# Patient Record
Sex: Male | Born: 1970 | Race: Black or African American | Hispanic: No | Marital: Single | Smoking: Current every day smoker
Health system: Southern US, Community
[De-identification: ages and names within clinical notes are randomized; demographics above are authoritative.]

## PROBLEM LIST (undated history)

## (undated) DIAGNOSIS — I10 Essential (primary) hypertension: Secondary | ICD-10-CM

---

## 2002-08-29 ENCOUNTER — Emergency Department (HOSPITAL_COMMUNITY): Admission: EM | Admit: 2002-08-29 | Discharge: 2002-08-29 | Payer: Self-pay | Admitting: Emergency Medicine

## 2002-11-27 ENCOUNTER — Emergency Department (HOSPITAL_COMMUNITY): Admission: EM | Admit: 2002-11-27 | Discharge: 2002-11-27 | Payer: Self-pay | Admitting: Emergency Medicine

## 2002-11-27 ENCOUNTER — Encounter: Payer: Self-pay | Admitting: Emergency Medicine

## 2003-08-30 ENCOUNTER — Emergency Department (HOSPITAL_COMMUNITY): Admission: EM | Admit: 2003-08-30 | Discharge: 2003-08-30 | Payer: Self-pay | Admitting: Emergency Medicine

## 2010-07-06 ENCOUNTER — Emergency Department (HOSPITAL_COMMUNITY)
Admission: EM | Admit: 2010-07-06 | Discharge: 2010-07-06 | Payer: Self-pay | Source: Home / Self Care | Admitting: Emergency Medicine

## 2010-07-13 ENCOUNTER — Emergency Department (HOSPITAL_COMMUNITY): Admission: EM | Admit: 2010-07-13 | Discharge: 2009-11-26 | Payer: Self-pay | Admitting: Emergency Medicine

## 2011-04-11 ENCOUNTER — Emergency Department (HOSPITAL_COMMUNITY): Payer: Self-pay

## 2011-04-11 ENCOUNTER — Emergency Department (HOSPITAL_COMMUNITY)
Admission: EM | Admit: 2011-04-11 | Discharge: 2011-04-11 | Disposition: A | Discharge status: Home / Self Care | Payer: Self-pay | Attending: Emergency Medicine | Admitting: Emergency Medicine

## 2011-04-11 DIAGNOSIS — M25579 Pain in unspecified ankle and joints of unspecified foot: Secondary | ICD-10-CM | POA: Insufficient documentation

## 2011-04-11 DIAGNOSIS — M79609 Pain in unspecified limb: Secondary | ICD-10-CM | POA: Insufficient documentation

## 2011-04-11 DIAGNOSIS — X500XXA Overexertion from strenuous movement or load, initial encounter: Secondary | ICD-10-CM | POA: Insufficient documentation

## 2011-04-11 DIAGNOSIS — S92309A Fracture of unspecified metatarsal bone(s), unspecified foot, initial encounter for closed fracture: Secondary | ICD-10-CM | POA: Insufficient documentation

## 2012-03-17 ENCOUNTER — Emergency Department (HOSPITAL_COMMUNITY)
Admission: EM | Admit: 2012-03-17 | Discharge: 2012-03-17 | Disposition: A | Discharge status: Home / Self Care | Payer: Self-pay | Attending: Emergency Medicine | Admitting: Emergency Medicine

## 2012-03-17 ENCOUNTER — Encounter (HOSPITAL_COMMUNITY): Payer: Self-pay | Admitting: *Deleted

## 2012-03-17 DIAGNOSIS — IMO0002 Reserved for concepts with insufficient information to code with codable children: Secondary | ICD-10-CM

## 2012-03-17 DIAGNOSIS — F17200 Nicotine dependence, unspecified, uncomplicated: Secondary | ICD-10-CM | POA: Insufficient documentation

## 2012-03-17 DIAGNOSIS — S51809A Unspecified open wound of unspecified forearm, initial encounter: Secondary | ICD-10-CM | POA: Insufficient documentation

## 2012-03-17 MED ORDER — LIDOCAINE HCL 2 % IJ SOLN
10.0000 mL | Freq: Once | INTRAMUSCULAR | Status: AC
  Administered 2012-03-17: 20 mg via INTRADERMAL

## 2012-03-17 MED ORDER — TRAMADOL HCL 50 MG PO TABS: 50.0000 mg | ORAL_TABLET | Freq: Four times a day (QID) | ORAL | Status: AC | PRN

## 2012-03-17 MED ORDER — CEPHALEXIN 500 MG PO CAPS: 500.0000 mg | ORAL_CAPSULE | Freq: Four times a day (QID) | ORAL | Status: AC
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
## 2012-03-17 NOTE — ED Provider Notes (Signed)
Medical screening examination/treatment/procedure(s) were performed by non-physician practitioner and as supervising physician I was immediately available for consultation/collaboration.   Gwyneth Sprout, MD 03/17/12 [phone]
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
## 2012-03-31 ENCOUNTER — Emergency Department (HOSPITAL_COMMUNITY)
Admission: EM | Admit: 2012-03-31 | Discharge: 2012-03-31 | Disposition: A | Discharge status: Home / Self Care | Payer: Self-pay | Attending: Emergency Medicine | Admitting: Emergency Medicine

## 2012-03-31 ENCOUNTER — Encounter (HOSPITAL_COMMUNITY): Payer: Self-pay | Admitting: *Deleted

## 2012-03-31 DIAGNOSIS — I10 Essential (primary) hypertension: Secondary | ICD-10-CM | POA: Insufficient documentation

## 2012-03-31 DIAGNOSIS — Z4802 Encounter for removal of sutures: Secondary | ICD-10-CM | POA: Insufficient documentation

## 2012-03-31 DIAGNOSIS — F17200 Nicotine dependence, unspecified, uncomplicated: Secondary | ICD-10-CM | POA: Insufficient documentation

## 2012-03-31 HISTORY — DX: Essential (primary) hypertension: I10

## 2012-03-31 NOTE — ED Notes (Signed)
Pt presents wanting suture check, has had sutures for 14 days. Patient sts that the area sutured is tender to the touch, area appears slightly red. Patient sts no headache, fever, nausea or abdominal pain.

## 2012-04-02 NOTE — ED Provider Notes (Signed)
History     CSN: [id number]  Arrival date & time 03/31/12  1721   None     Chief Complaint  Patient presents with  . Wound Check    (Consider location/radiation/quality/duration/timing/severity/associated sxs/prior treatment) HPI Comments: Patient presents for suture removal after a laceration occurring 14 days ago. Patient reports mild tenderness at this site but no complications. He denies any drainage from the wound site. He denies NVD, fever, right arm pain.   Patient is a 41 y.o. male presenting with wound check.  Wound Check     Past Medical History  Diagnosis Date  . Hypertension     History reviewed. No pertinent past surgical history.  No family history on file.  History  Substance Use Topics  . Smoking status: Current Everyday Smoker  . Smokeless tobacco: Not on file  . Alcohol Use: Yes     socially, weekends      Review of Systems  Constitutional: Negative for fever, chills, diaphoresis and fatigue.  Respiratory: Negative for shortness of breath.   Cardiovascular: Negative for chest pain.  Gastrointestinal: Negative for nausea, vomiting and diarrhea.  Skin: Positive for wound.  Neurological: Negative for dizziness, weakness, numbness and headaches.    Allergies  Review of patient's allergies indicates no known allergies.  Home Medications   Current Outpatient Rx  Name Route Sig Dispense Refill  . CEPHALEXIN 500 MG PO CAPS Oral Take 500 mg by mouth 4 (four) times daily.      BP 149/98  Pulse 67  Temp 98.4 F (36.9 C) (Oral)  Resp 16  Ht 5\' 5"  (1.651 m)  Wt 135 lb (61.236 kg)  BMI 22.47 kg/m2  SpO2 100%  Physical Exam  Nursing note and vitals reviewed. Constitutional: He is oriented to person, place, and time. He appears well-developed and well-nourished. No distress.  HENT:  Head: Normocephalic and atraumatic.  Eyes: Conjunctivae are normal. No scleral icterus.  Neck: Normal range of motion. Neck supple.  Cardiovascular: Normal  rate and regular rhythm.  Exam reveals no gallop and no friction rub.   No murmur heard. Pulmonary/Chest: Effort normal and breath sounds normal. No respiratory distress. He has no wheezes. He has no rales.  Musculoskeletal: Normal range of motion.  Neurological: He is alert and oriented to person, place, and time.  Skin: Skin is warm and dry. He is not diaphoretic.       3 cm healed laceration of volar aspect of right forearm. No drainage noted. Mild tenderness to palpation of site. Small scabs noted where the sutures were.   Psychiatric: He has a normal mood and affect. His behavior is normal.    ED Course  Procedures (including critical care time)  SUTURE REMOVAL Performed by: Emilia Beck  Consent: Verbal consent obtained. Patient identity confirmed: provided demographic data Time out: Immediately prior to procedure a "time out" was called to verify the correct patient, procedure, equipment, support staff and site/side marked as required.  Location details: right forearm  Wound Appearance: clean  Sutures/Staples Removed: 3 sutures  Facility: sutures placed in this facility Patient tolerance: Patient tolerated the procedure well with no immediate complications.     Labs Reviewed - No data to display No results found.   1. Visit for suture removal       MDM  Patient's wound is healing with no signs of infection. No further evaluation needed at this time. Patient can be discharged with instructions to keep the area covered for the next few days  while at work, due to his work environment being around [employer].         Emilia Beck, PA-C 04/02/12 0023

## 2012-04-04 NOTE — ED Provider Notes (Signed)
Medical screening examination/treatment/procedure(s) were performed by non-physician practitioner and as supervising physician I was immediately available for consultation/collaboration.  Muntaha Vermette T Marella Vanderpol, MD 04/04/12 2356 

## 2013-03-25 ENCOUNTER — Emergency Department (HOSPITAL_COMMUNITY)
Admission: EM | Admit: 2013-03-25 | Discharge: 2013-03-25 | Discharge status: Against Medical Advice | Payer: Self-pay | Attending: Emergency Medicine | Admitting: Emergency Medicine

## 2013-03-25 ENCOUNTER — Encounter (HOSPITAL_COMMUNITY): Payer: Self-pay | Admitting: Emergency Medicine

## 2013-03-25 DIAGNOSIS — I10 Essential (primary) hypertension: Secondary | ICD-10-CM | POA: Insufficient documentation

## 2013-03-25 DIAGNOSIS — Z202 Contact with and (suspected) exposure to infections with a predominantly sexual mode of transmission: Secondary | ICD-10-CM | POA: Insufficient documentation

## 2013-03-25 DIAGNOSIS — R109 Unspecified abdominal pain: Secondary | ICD-10-CM | POA: Insufficient documentation

## 2013-03-25 DIAGNOSIS — N489 Disorder of penis, unspecified: Secondary | ICD-10-CM | POA: Insufficient documentation

## 2013-03-25 DIAGNOSIS — F17200 Nicotine dependence, unspecified, uncomplicated: Secondary | ICD-10-CM | POA: Insufficient documentation

## 2013-03-25 LAB — URINALYSIS, ROUTINE W REFLEX MICROSCOPIC
Bilirubin Urine: NEGATIVE
Leukocytes, UA: NEGATIVE
Nitrite: NEGATIVE
Specific Gravity, Urine: 1.031 — ABNORMAL HIGH (ref 1.005–1.030)
Urobilinogen, UA: 0.2 mg/dL (ref 0.0–1.0)
pH: 5.5 (ref 5.0–8.0)

## 2013-03-25 MED ORDER — CEFTRIAXONE SODIUM 250 MG IJ SOLR
250.0000 mg | Freq: Once | INTRAMUSCULAR | Status: AC
  Administered 2013-03-25: 250 mg via INTRAMUSCULAR
  Filled 2013-03-25: qty 250

## 2013-03-25 MED ORDER — AZITHROMYCIN 1 G PO PACK
1.0000 g | PACK | Freq: Once | ORAL | Status: AC
  Administered 2013-03-25: 1 g via ORAL
  Filled 2013-03-25: qty 1

## 2013-03-25 NOTE — ED Provider Notes (Signed)
CSN: [id number]     Arrival date & time 03/25/13  1251 History     None    Chief Complaint  Patient presents with  . Exposure to STD  . Penis Pain   (Consider location/radiation/quality/duration/timing/severity/associated sxs/prior Treatment) HPI Comments: Patient is a 42 year old male past medical history significant for hypertension presented to the emergency department for 4 days of suprapubic discomfort along with burning and itching at the tip of the penis. He describes any testicular or penile swelling or pain, penile discharge, dysuria, frequency, urgency.   Patient is a 42 y.o. male presenting with STD exposure and penile pain.  Exposure to STD Associated symptoms include abdominal pain. Pertinent negatives include no fever, nausea or vomiting.  Penis Pain Associated symptoms include abdominal pain. Pertinent negatives include no fever, nausea or vomiting.    Past Medical History  Diagnosis Date  . Hypertension    No past surgical history on file. No family history on file. History  Substance Use Topics  . Smoking status: Current Every Day Smoker  . Smokeless tobacco: Not on file  . Alcohol Use: Yes     Comment: socially, weekends    Review of Systems  Constitutional: Negative for fever.  Gastrointestinal: Positive for abdominal pain. Negative for nausea, vomiting, diarrhea, constipation, blood in stool, abdominal distention, anal bleeding and rectal pain.  Genitourinary: Positive for penile pain. Negative for dysuria, penile swelling, scrotal swelling and testicular pain.  All other systems reviewed and are negative.    Allergies  Review of patient's allergies indicates no known allergies.  Home Medications  No current outpatient prescriptions on file. BP 138/84  Pulse 74  Temp(Src) 98.4 F (36.9 C)  Resp 14  SpO2 98% Physical Exam  Constitutional: He is oriented to person, place, and time. He appears well-developed and well-nourished.  HENT:  Head:  Normocephalic and atraumatic.  Eyes: Conjunctivae are normal.  Neck: Neck supple.  Cardiovascular: Normal rate, regular rhythm and normal heart sounds.   Pulmonary/Chest: Effort normal and breath sounds normal.  Abdominal: Soft. Bowel sounds are normal. There is tenderness in the suprapubic area. There is no rigidity, no rebound, no guarding and no CVA tenderness.  Genitourinary: Testes normal and penis normal. Right testis shows no mass, no swelling and no tenderness. Left testis shows no mass, no swelling and no tenderness. Circumcised. No penile erythema or penile tenderness. No discharge found.  Lymphadenopathy:       Right: No inguinal adenopathy present.       Left: No inguinal adenopathy present.  Neurological: He is alert and oriented to person, place, and time.  Skin: Skin is warm and dry.    ED Course   Procedures (including critical care time)  Medications  azithromycin (ZITHROMAX) powder 1 g (1 g Oral Given 03/25/13 1615)  cefTRIAXone (ROCEPHIN) injection 250 mg (250 mg Intramuscular Given 03/25/13 1615)     Labs Reviewed  GC/CHLAMYDIA PROBE AMP  URINALYSIS, ROUTINE W REFLEX MICROSCOPIC   No results found. 1. Possible exposure to STD     MDM  Afebrile, AAOx4, NAD non toxic appearing. Patient to be discharged with instructions to follow up with PCP. Pt understands GC/Chlamydia cultures pending and that they will need to inform all sexual partners within the last 6 months if results return positive. Pt has been treated prophylacticly with azithromycin and rocephin due to pts history, pelvic exam, and wet prep with increased WBCs. Pt advised that he will receive a call in 48 hours if the test  is positive and to refrain from sexual activity for 48 hours. If the test is positive, pt is advised to refrain from sexual activity for 10 days for the medicine to take effect.  Discussed that because pt has had recent unprotected sex, might want to consider getting tested for HIV as  well. Counseled pt that latex condoms are the only way to prevent against STDs or HIV. Patient eloped from ED. Left prior to receiving UA results or d/c paperwork.   Jeannetta Ellis, PA-C 03/25/13 1634  Patient returned to ED for UA results. UA results reviewed. Afebrile, AAOx4, NAD, non-toxic appearing. Tolerated PO intake in ED. Results discussed. F/u advised w/ PCP. Patient is agreeable to plan. Patient is stable at time of discharge    Jeannetta Ellis, PA-C 03/26/13 1525

## 2013-03-25 NOTE — Progress Notes (Signed)
[facility] provided patient with a list of primary care resources in [place] and a [facility] [employer].

## 2013-03-25 NOTE — ED Notes (Signed)
Pt c/o low abd pain and pain in his penis x 4 days.  Denies itching or discharge.  Pt coming in with his girlfriend who is c/o vaginal discharge.

## 2013-03-25 NOTE — ED Notes (Signed)
Pt left prior to receiving d/c instructions. PA states she saw pt walk out.

## 2013-03-25 NOTE — ED Notes (Signed)
Pt now back in room. PA aware.

## 2013-03-26 LAB — GC/CHLAMYDIA PROBE AMP
CT Probe RNA: NEGATIVE
GC Probe RNA: NEGATIVE

## 2013-03-26 NOTE — ED Provider Notes (Signed)
Medical screening examination/treatment/procedure(s) were performed by non-physician practitioner and as supervising physician I was immediately available for consultation/collaboration.    Vernal Rutan R Shyan Scalisi, MD 03/26/13 1536 

## 2013-10-18 ENCOUNTER — Emergency Department (HOSPITAL_COMMUNITY)
Admission: EM | Admit: 2013-10-18 | Discharge: 2013-10-18 | Disposition: A | Discharge status: Home / Self Care | Payer: Self-pay | Attending: Emergency Medicine | Admitting: Emergency Medicine

## 2013-10-18 ENCOUNTER — Encounter (HOSPITAL_COMMUNITY): Payer: Self-pay | Admitting: Emergency Medicine

## 2013-10-18 DIAGNOSIS — F17200 Nicotine dependence, unspecified, uncomplicated: Secondary | ICD-10-CM | POA: Insufficient documentation

## 2013-10-18 DIAGNOSIS — Z202 Contact with and (suspected) exposure to infections with a predominantly sexual mode of transmission: Secondary | ICD-10-CM

## 2013-10-18 DIAGNOSIS — I10 Essential (primary) hypertension: Secondary | ICD-10-CM | POA: Insufficient documentation

## 2013-10-18 DIAGNOSIS — Z792 Long term (current) use of antibiotics: Secondary | ICD-10-CM | POA: Insufficient documentation

## 2013-10-18 DIAGNOSIS — Z2089 Contact with and (suspected) exposure to other communicable diseases: Secondary | ICD-10-CM | POA: Insufficient documentation

## 2013-10-18 MED ORDER — METRONIDAZOLE 500 MG PO TABS: 500.0000 mg | ORAL_TABLET | Freq: Two times a day (BID) | ORAL | Status: DC

## 2013-10-18 MED ORDER — METRONIDAZOLE 500 MG PO TABS
2000.0000 mg | ORAL_TABLET | Freq: Once | ORAL | Status: AC
  Administered 2013-10-18: 2000 mg via ORAL
  Filled 2013-10-18: qty 4

## 2013-10-18 MED ORDER — CEFTRIAXONE SODIUM 250 MG IJ SOLR
250.0000 mg | Freq: Once | INTRAMUSCULAR | Status: AC
  Administered 2013-10-18: 250 mg via INTRAMUSCULAR
  Filled 2013-10-18: qty 250

## 2013-10-18 MED ORDER — AZITHROMYCIN 250 MG PO TABS
1000.0000 mg | ORAL_TABLET | Freq: Once | ORAL | Status: AC
  Administered 2013-10-18: 1000 mg via ORAL
  Filled 2013-10-18: qty 4

## 2013-10-18 MED ORDER — STERILE WATER FOR INJECTION IJ SOLN
INTRAMUSCULAR | Status: AC
  Filled 2013-10-18: qty 10

## 2013-10-18 NOTE — ED Provider Notes (Signed)
CSN: [id number]     Arrival date & time 10/18/13  1812 History   First MD Initiated Contact with Patient 10/18/13 1831     Chief Complaint  Patient presents with  . SEXUALLY TRANSMITTED DISEASE     (Consider location/radiation/quality/duration/timing/severity/associated sxs/prior Treatment) Patient is a 43 y.o. male presenting with male genitourinary complaint. The history is provided by the patient.  Male GU Problem Presenting symptoms: no dysuria, no penile discharge, no penile pain and no scrotal pain   Presenting symptoms comment:  Pt wants tx for Trich. Gf tested positive for Trich 2 weeks ago and wants him treated. He has no sxs Context: spontaneously   Relieved by:  Nothing Worsened by:  Nothing tried Ineffective treatments:  None tried Associated symptoms: no abdominal pain, no fever, no flank pain, no genital itching, no genital lesions, no genital rash, no hematuria, no nausea, no penile redness, no penile swelling, no scrotal swelling, no urinary frequency, no urinary hesitation, no urinary incontinence, no urinary retention and no vomiting   Risk factors: STD exposure     Past Medical History  Diagnosis Date  . Hypertension    History reviewed. No pertinent past surgical history. History reviewed. No pertinent family history. History  Substance Use Topics  . Smoking status: Current Every Day Smoker  . Smokeless tobacco: Not on file  . Alcohol Use: Yes     Comment: socially, weekends    Review of Systems  Constitutional: Negative for fever, activity change and appetite change.  HENT: Negative for congestion.   Eyes: Negative for discharge, redness and itching.  Respiratory: Negative for cough, shortness of breath and wheezing.   Cardiovascular: Negative for chest pain.  Gastrointestinal: Negative for nausea, vomiting and abdominal pain.  Genitourinary: Negative for bladder incontinence, dysuria, hesitancy, frequency, hematuria, flank pain, discharge, penile  swelling, scrotal swelling, genital sores, penile pain and testicular pain.  Skin: Negative for rash and wound.  Neurological: Negative for seizures and syncope.      Allergies  Review of patient's allergies indicates no known allergies.  Home Medications   Current Outpatient Rx  Name  Route  Sig  Dispense  Refill  . metroNIDAZOLE (FLAGYL) 500 MG tablet   Oral   Take 1 tablet (500 mg total) by mouth 2 (two) times daily.   14 tablet   0    BP 125/84  Pulse 84  Temp(Src) 98.5 F (36.9 C) (Oral)  Resp 18  SpO2 96% Physical Exam  Vitals reviewed. Constitutional: He is oriented to person, place, and time. He appears well-developed and well-nourished. No distress.  HENT:  Head: Normocephalic and atraumatic.  Mouth/Throat: Oropharynx is clear and moist. No oropharyngeal exudate.  Eyes: Conjunctivae and EOM are normal. Pupils are equal, round, and reactive to light. Right eye exhibits no discharge. Left eye exhibits no discharge. No scleral icterus.  Neck: Normal range of motion. Neck supple.  Cardiovascular: Normal rate, regular rhythm, normal heart sounds and intact distal pulses.  Exam reveals no gallop and no friction rub.   No murmur heard. Pulmonary/Chest: Effort normal and breath sounds normal. No respiratory distress. He has no wheezes. He has no rales.  Abdominal: Soft. He exhibits no distension and no mass. There is no tenderness.  Genitourinary: Penis normal. No penile tenderness.  GU exam: no lesions, wounds, or rash. No discharge. No ttp of glans. No scrotal ttp, edema, redness. No hernia  Musculoskeletal: Normal range of motion.  Neurological: He is alert and oriented to person, place, and  time. No cranial nerve deficit. He exhibits normal muscle tone. Coordination normal.  Skin: Skin is warm. No rash noted. He is not diaphoretic.    ED Course  Procedures (including critical care time) Labs Review Labs Reviewed - No data to display Imaging Review No results  found.   EKG Interpretation None      MDM   MDM: 43 y.o. AAM w/ cc: of exposure to Trich. Gf treated 2 weeks ago and wants him treated. No sxs. No complaints. AFVSS, normal exam. Normal GU exam. Offered GC tx which they accepted. Given Rocephin, Azithro, Flagyl. Discharged. Follow up PCP as needed. Care d/w my attending.  Final diagnoses:  Trichomonas exposure    Discharged   Pilar Jarvis, MD 10/18/13 [phone]

## 2013-10-18 NOTE — Discharge Instructions (Signed)
Sexually Transmitted Disease A sexually transmitted disease (STD) is a disease or infection. It may be passed from person to person. It usually is passed during sex. STDs can be spread by different types of germs. These germs are bacteria, viruses, and parasites. An STD can be passed through:  Spit (saliva).  Semen.  Blood.  Mucus from the vagina.  Pee (urine). HOW CAN I LESSEN MY CHANCES OF GETTING AN STD?  Only use condoms labeled "latex," dental dams, and lubricants that wash away with water (water soluble). Do not use petroleum jelly or oils.  Get shots (vaccines) for HPV and hepatitis.  Avoid risky sex behavior that can break the skin. WHAT SHOULD I DO IF I THINK I HAVE AN STD?  See your doctor.  Tell your sex partner(s) that you have an STD. They should be tested and treated.  Do not have sex until your doctor says it is OK. WHEN SHOULD I GET HELP? Get help if:  You have bad belly (abdominal) pain.  You are a man and have puffiness (swelling) or pain in your testicles.  You are a woman and have puffiness in your vagina. MAKE SURE YOU:  Understand these instructions. Document Released: 08/30/2004 Document Revised: 05/13/2013 Document Reviewed: 01/16/2013 [employer] Patient Information 2014 [employer].  

## 2013-10-18 NOTE — ED Notes (Signed)
Pt reports being exposed to an std and needing to get it checked. Denies any penile discharge.

## 2013-10-19 ENCOUNTER — Emergency Department (HOSPITAL_COMMUNITY)
Admission: EM | Admit: 2013-10-19 | Discharge: 2013-10-19 | Disposition: A | Discharge status: Home / Self Care | Payer: No Typology Code available for payment source | Attending: Emergency Medicine | Admitting: Emergency Medicine

## 2013-10-19 ENCOUNTER — Emergency Department (HOSPITAL_COMMUNITY): Payer: No Typology Code available for payment source

## 2013-10-19 ENCOUNTER — Encounter (HOSPITAL_COMMUNITY): Payer: Self-pay | Admitting: Emergency Medicine

## 2013-10-19 DIAGNOSIS — S335XXA Sprain of ligaments of lumbar spine, initial encounter: Secondary | ICD-10-CM

## 2013-10-19 DIAGNOSIS — S139XXA Sprain of joints and ligaments of unspecified parts of neck, initial encounter: Secondary | ICD-10-CM | POA: Insufficient documentation

## 2013-10-19 DIAGNOSIS — S161XXA Strain of muscle, fascia and tendon at neck level, initial encounter: Secondary | ICD-10-CM

## 2013-10-19 DIAGNOSIS — Y9389 Activity, other specified: Secondary | ICD-10-CM | POA: Insufficient documentation

## 2013-10-19 DIAGNOSIS — I10 Essential (primary) hypertension: Secondary | ICD-10-CM | POA: Insufficient documentation

## 2013-10-19 DIAGNOSIS — F17200 Nicotine dependence, unspecified, uncomplicated: Secondary | ICD-10-CM | POA: Insufficient documentation

## 2013-10-19 MED ORDER — HYDROCODONE-ACETAMINOPHEN 5-325 MG PO TABS: 1.0000 | ORAL_TABLET | ORAL | Status: DC | PRN

## 2013-10-19 MED ORDER — DIAZEPAM 5 MG PO TABS: 5.0000 mg | ORAL_TABLET | Freq: Two times a day (BID) | ORAL | Status: DC

## 2013-10-19 MED ORDER — HYDROCODONE-ACETAMINOPHEN 5-325 MG PO TABS
1.0000 | ORAL_TABLET | Freq: Once | ORAL | Status: AC
  Administered 2013-10-19: 1 via ORAL
  Filled 2013-10-19: qty 1

## 2013-10-19 MED ORDER — IBUPROFEN 800 MG PO TABS: 800.0000 mg | ORAL_TABLET | Freq: Three times a day (TID) | ORAL | Status: DC
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
## 2013-10-19 NOTE — ED Notes (Signed)
c-collar placed per RN

## 2013-10-19 NOTE — Discharge Instructions (Signed)
Take vicodin as needed for severe pain and valium as needed for spasm.   Do not drive within four hours of taking these medications (may cause drowsiness or confusion).   Apply heat or ice to neck and back and avoid activities that aggravate pain.  You should return to the ER if you develop change in or worsening of pain, fever (100.5 degrees or greater), increased arm weakness/tingling, inability to walk due to leg weakness or loss of control of bladder/bowels.   Call [facility] [phone]) if you do not have a primary care doctor and would like assistance with finding one.
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
## 2013-10-20 NOTE — ED Provider Notes (Signed)
Medical screening examination/treatment/procedure(s) were performed by non-physician practitioner and as supervising physician I was immediately available for consultation/collaboration.   EKG Interpretation None        Junius ArgyleForrest S Enslee Bibbins, MD 10/20/13 1534
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
## 2015-02-22 ENCOUNTER — Emergency Department (HOSPITAL_COMMUNITY): Payer: Self-pay

## 2015-02-22 ENCOUNTER — Encounter (HOSPITAL_COMMUNITY): Payer: Self-pay

## 2015-02-22 ENCOUNTER — Observation Stay (HOSPITAL_COMMUNITY)
Admission: EM | Admit: 2015-02-22 | Discharge: 2015-02-23 | Disposition: A | Discharge status: Home / Self Care | Payer: Self-pay | Attending: Family Medicine | Admitting: Family Medicine

## 2015-02-22 DIAGNOSIS — Z7982 Long term (current) use of aspirin: Secondary | ICD-10-CM | POA: Insufficient documentation

## 2015-02-22 DIAGNOSIS — M79641 Pain in right hand: Secondary | ICD-10-CM

## 2015-02-22 DIAGNOSIS — F17210 Nicotine dependence, cigarettes, uncomplicated: Secondary | ICD-10-CM | POA: Insufficient documentation

## 2015-02-22 DIAGNOSIS — I10 Essential (primary) hypertension: Secondary | ICD-10-CM | POA: Diagnosis present

## 2015-02-22 DIAGNOSIS — R202 Paresthesia of skin: Secondary | ICD-10-CM | POA: Insufficient documentation

## 2015-02-22 DIAGNOSIS — R0602 Shortness of breath: Secondary | ICD-10-CM | POA: Insufficient documentation

## 2015-02-22 DIAGNOSIS — R079 Chest pain, unspecified: Principal | ICD-10-CM | POA: Diagnosis present

## 2015-02-22 LAB — CBC
HCT: 52.5 % — ABNORMAL HIGH (ref 39.0–52.0)
HEMOGLOBIN: 18.6 g/dL — AB (ref 13.0–17.0)
MCH: 33.1 pg (ref 26.0–34.0)
MCHC: 35.4 g/dL (ref 30.0–36.0)
MCV: 93.4 fL (ref 78.0–100.0)
PLATELETS: 352 10*3/uL (ref 150–400)
RBC: 5.62 MIL/uL (ref 4.22–5.81)
RDW: 12.4 % (ref 11.5–15.5)
WBC: 8.3 10*3/uL (ref 4.0–10.5)

## 2015-02-22 LAB — BASIC METABOLIC PANEL
ANION GAP: 12 (ref 5–15)
BUN: 11 mg/dL (ref 6–20)
CO2: 28 mmol/L (ref 22–32)
Calcium: 10.3 mg/dL (ref 8.9–10.3)
Chloride: 101 mmol/L (ref 101–111)
Creatinine, Ser: 1.26 mg/dL — ABNORMAL HIGH (ref 0.61–1.24)
GFR calc Af Amer: >60 mL/min (ref >60)
Glucose, Bld: 105 mg/dL — ABNORMAL HIGH (ref 65–99)
POTASSIUM: 4.1 mmol/L (ref 3.5–5.1)
Sodium: 141 mmol/L (ref 135–145)

## 2015-02-22 LAB — I-STAT TROPONIN, ED: Troponin i, poc: 0 ng/mL (ref 0.00–0.08)

## 2015-02-22 MED ORDER — ACETAMINOPHEN 650 MG RE SUPP
650.0000 mg | Freq: Four times a day (QID) | RECTAL | Status: DC | PRN
Start: 2015-02-22 — End: 2015-02-23

## 2015-02-22 MED ORDER — NITROGLYCERIN 0.4 MG SL SUBL: 0.4000 mg | SUBLINGUAL_TABLET | SUBLINGUAL | Status: DC | PRN

## 2015-02-22 MED ORDER — METOPROLOL TARTRATE 25 MG PO TABS
25.0000 mg | ORAL_TABLET | Freq: Two times a day (BID) | ORAL | Status: DC
  Administered 2015-02-23 (×2): 25 mg via ORAL
  Filled 2015-02-22 (×2): qty 1

## 2015-02-22 MED ORDER — ASPIRIN 81 MG PO CHEW
324.0000 mg | CHEWABLE_TABLET | Freq: Once | ORAL | Status: AC
  Administered 2015-02-22: 324 mg via ORAL
  Filled 2015-02-22: qty 4

## 2015-02-22 MED ORDER — ASPIRIN EC 81 MG PO TBEC
81.0000 mg | DELAYED_RELEASE_TABLET | Freq: Every day | ORAL | Status: DC
  Administered 2015-02-23: 81 mg via ORAL
  Filled 2015-02-22: qty 1

## 2015-02-22 MED ORDER — ACETAMINOPHEN 325 MG PO TABS: 650.0000 mg | ORAL_TABLET | Freq: Four times a day (QID) | ORAL | Status: DC | PRN

## 2015-02-22 MED ORDER — SODIUM CHLORIDE 0.9 % IJ SOLN
3.0000 mL | Freq: Two times a day (BID) | INTRAMUSCULAR | Status: DC
  Administered 2015-02-23: 3 mL via INTRAVENOUS

## 2015-02-22 MED ORDER — HEPARIN SODIUM (PORCINE) 5000 UNIT/ML IJ SOLN
5000.0000 [IU] | Freq: Three times a day (TID) | INTRAMUSCULAR | Status: DC
  Administered 2015-02-23 (×2): 5000 [IU] via SUBCUTANEOUS
  Filled 2015-02-22 (×3): qty 1

## 2015-02-22 NOTE — ED Provider Notes (Signed)
CSN: [id number]     Arrival date & time 02/22/15  1732 History   First MD Initiated Contact with Patient 02/22/15 1810     Chief Complaint  Patient presents with  . Chest Pain     (Consider location/radiation/quality/duration/timing/severity/associated sxs/prior Treatment) Patient is a 44 y.o. male presenting with chest pain.  Chest Pain Pain location:  L chest Pain quality comment:  Cramping Radiates to: to right side, numbness feeling around right back. Pain radiates to the back: yes   Pain severity:  Moderate Onset quality:  Gradual Duration:  1 day Timing:  Constant Progression:  Unchanged Chronicity:  Recurrent (recurrent episodes like this for several months, has not found care, no PCP) Context comment:  When standing for long period of time, cooking chicken or walking Relieved by:  Rest Associated symptoms: nausea and shortness of breath   Associated symptoms: no abdominal pain, no back pain, no cough, no diaphoresis, no fever, no headache, no syncope and not vomiting   Risk factors: hypertension, male sex and smoking   Risk factors: no coronary artery disease, no diabetes mellitus, no high cholesterol (doesnt see physician), no prior DVT/PE and no surgery     Past Medical History  Diagnosis Date  . Hypertension    History reviewed. No pertinent past surgical history. History reviewed. No pertinent family history. History  Substance Use Topics  . Smoking status: Current Every Day Smoker  . Smokeless tobacco: Not on file  . Alcohol Use: Yes     Comment: socially, weekends    Review of Systems  Constitutional: Negative for fever and diaphoresis.  HENT: Negative for sore throat.   Eyes: Negative for visual disturbance.  Respiratory: Positive for shortness of breath. Negative for cough.   Cardiovascular: Positive for chest pain. Negative for syncope.  Gastrointestinal: Positive for nausea. Negative for vomiting and abdominal pain.  Genitourinary: Negative for  difficulty urinating.  Musculoskeletal: Negative for back pain and neck stiffness.  Skin: Negative for rash.  Neurological: Negative for syncope and headaches.      Allergies  Review of patient's allergies indicates no known allergies.  Home Medications   Prior to Admission medications   Medication Sig Start Date End Date Taking? Authorizing Provider  aspirin EC 81 MG EC tablet Take 1 tablet (81 mg total) by mouth daily. 02/23/15   Rhetta Mura, MD  metoprolol tartrate (LOPRESSOR) 25 MG tablet Take 1 tablet (25 mg total) by mouth 2 (two) times daily. 02/23/15   Rhetta Mura, MD   BP 134/78 mmHg  Pulse 56  Temp(Src) 98.1 F (36.7 C) (Oral)  Resp 20  Ht  (1.626 m)  Wt 111 lb 12.8 oz (50.712 kg)  BMI 19.18 kg/m2  SpO2 100% Physical Exam  Constitutional: He is oriented to person, place, and time. He appears well-developed and well-nourished. No distress.  HENT:  Head: Normocephalic and atraumatic.  Eyes: Conjunctivae and EOM are normal.  Neck: Normal range of motion.  Cardiovascular: Normal rate, regular rhythm, normal heart sounds and intact distal pulses.  Exam reveals no gallop and no friction rub.   No murmur heard. Pulmonary/Chest: Effort normal and breath sounds normal. No respiratory distress. He has no wheezes. He has no rales.  Abdominal: Soft. He exhibits no distension. There is no tenderness. There is no guarding.  Musculoskeletal: He exhibits no edema.  Neurological: He is alert and oriented to person, place, and time.  Skin: Skin is warm and dry. He is not diaphoretic.  Nursing note and  vitals reviewed.   ED Course  Procedures (including critical care time) Labs Review Labs Reviewed  BASIC METABOLIC PANEL - Abnormal; Notable for the following:    Glucose, Bld 105 (*)    Creatinine, Ser 1.26 (*)    All other components within normal limits  CBC - Abnormal; Notable for the following:    Hemoglobin 18.6 (*)    HCT 52.5 (*)    All other  components within normal limits  BASIC METABOLIC PANEL - Abnormal; Notable for the following:    Glucose, Bld 107 (*)    All other components within normal limits  CBC  CREATININE, SERUM  TROPONIN I  TROPONIN I  TROPONIN I  CBC  LIPID PANEL  I-STAT TROPOININ, ED    Imaging Review Dg Chest 2 View  02/22/2015   CLINICAL DATA:  44 year old male with chest pain  EXAM: CHEST  2 VIEW  COMPARISON:  None.  FINDINGS: The heart size and mediastinal contours are within normal limits. Both lungs are clear. The visualized skeletal structures are unremarkable.  IMPRESSION: No active cardiopulmonary disease.   Electronically Signed   By: Elgie CollardArash  Radparvar M.D.   On: 02/22/2015 19:16   Mr Cervical Spine Wo Contrast  02/23/2015   CLINICAL DATA:  Neck pain radiating to the right shoulder, arm, and hand with right hand numbness and tingling for 1 year.  EXAM: MRI CERVICAL SPINE WITHOUT CONTRAST  TECHNIQUE: Multiplanar, multisequence MR imaging of the cervical spine was performed. No intravenous contrast was administered.  COMPARISON:  10/19/2013 cervical spine CT  FINDINGS: Motion degraded imaging, especially affecting axial acquisition.  No marrow signal abnormality suggestive of fracture, infection, or neoplasm.  Normal cord signal and morphology.  No extra-spinal findings to explain. Flow related signal loss in the visible cervical and carotid arteries.  Degenerative changes:  C2-3: Unremarkable.  C3-4: Mild disc bulging.  No impingement  C4-5: Mild degenerative disc narrowing with small annular fissure. Spurring, greatest to the right uncovertebral region with advanced foraminal stenosis and C5 impingement. Posterior endplate ridging effaces ventral CSF without cord mass effect.  C5-6: Degenerative disc disease with endplate spurring effacing the ventral CSF. There is no cord mass effect. Uncovertebral spurs without foraminal impingement.  C6-7: Degenerative disc narrowing. Bilateral uncovertebral spurs without  foraminal impingement. Mild effacement of ventral CSF without cord mass effect.  C7-T1:Unremarkable.  IMPRESSION: 1. Degenerative disc disease from C4-5 to C6-7. 2. 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   Electronically Signed   By: Marnee SpringJonathon  Watts M.D.   On: 02/23/2015 07:14     EKG Interpretation   Date/Time:  Tuesday February 22 2015 20:13:02 EDT Ventricular Rate:  61 PR Interval:  124 QRS Duration: 85 QT Interval:  401 QTC Calculation: 404 R Axis:   55 Text Interpretation:  Sinus rhythm Borderline T wave abnormalities  Confirmed by [facility] MD, Kaito Schulenburg ([id number]) on 02/23/2015 1:40:13 AM      MDM   Final diagnoses:  Right hand pain  Chest pain  43yo male with history of hypertension, smoking, no regular medical care, presents with concern for chest pain. Pt reports CP which is exertional over several months, improvement with nitro in ED. Doubt PE, pericarditis, dissection, pneumonia, pneumothorax given exam, history, XR and EKG findings.  No prior EKGs available, however TWI present in inferior leads on initial EKG, repeat shows borderline TW abnormalities. Pt HEAR score of 4, and given exertional symptoms will admit for further observation and care for possible angina vs  ACS rule out.       Alvira Monday, MD 02/23/15 [phone]

## 2015-02-22 NOTE — ED Notes (Signed)
Called case management and left a message.
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
## 2015-02-22 NOTE — ED Notes (Signed)
Pt presents with c/o chest pain that he reports he has had for several months. Pt reports that the pain is in the right side of his chest and radiates to the right area of his back. Pt reports that the pain feels like cramping in nature and sometimes he feels some numbness as well. Pt in NAD at this time.

## 2015-02-22 NOTE — Progress Notes (Signed)
[facility] ED CM noted Cm consult for medication needs Pt home meds only list Narcotics and advil There is not a [facility] CM program to assist with narcotics.  CM informed pt and he voiced understanding that [facility] does not have a program to assist with hydrocodone and valium  CM spoke with pt who [place].  CM discussed and provided written information for uninsured accepting pcps, discussed the importance of pcp vs EDP services for f/u care, www.needymeds.org, www.goodrx.com, discounted pharmacies and other [place] resources such as [facility] , [facility], affordable care act, financial assistance, uninsured dental services, [place] med assist, DSS and  health department  Reviewed resources for [place] uninsured accepting pcps like [facility], [facility] [address], [facility], [facility], local urgent care centers, [facility], [facility], general medical clinics, [facility], [facility] plus others, medication resources, [facility] out patient pharmacies and housing Pt voiced understanding and appreciation of resources provided   Provided [facility] contact information Pt agreed to a referral Cm completed referral Pt to be contact by [facility] clinical liaison Pt states he has never had an orange card nor has signed up for affordable care act Encouraged him to sign up for affordable care act Cm verified and updated contact information in EPIC

## 2015-02-23 ENCOUNTER — Observation Stay (HOSPITAL_COMMUNITY): Payer: Self-pay

## 2015-02-23 DIAGNOSIS — I10 Essential (primary) hypertension: Secondary | ICD-10-CM | POA: Diagnosis present

## 2015-02-23 DIAGNOSIS — Z720 Tobacco use: Secondary | ICD-10-CM

## 2015-02-23 DIAGNOSIS — R0789 Other chest pain: Secondary | ICD-10-CM

## 2015-02-23 LAB — TROPONIN I
Troponin I: <0.03 ng/mL (ref <0.031)
Troponin I: <0.03 ng/mL (ref <0.031)

## 2015-02-23 LAB — CBC
HCT: 46.8 % (ref 39.0–52.0)
HEMATOCRIT: 46.2 % (ref 39.0–52.0)
HEMOGLOBIN: 15.9 g/dL (ref 13.0–17.0)
Hemoglobin: 16.1 g/dL (ref 13.0–17.0)
MCH: 32 pg (ref 26.0–34.0)
MCH: 32.3 pg (ref 26.0–34.0)
MCHC: 34.4 g/dL (ref 30.0–36.0)
MCHC: 34.4 g/dL (ref 30.0–36.0)
MCV: 93 fL (ref 78.0–100.0)
MCV: 93.7 fL (ref 78.0–100.0)
PLATELETS: 326 10*3/uL (ref 150–400)
Platelets: 308 10*3/uL (ref 150–400)
RBC: 4.93 MIL/uL (ref 4.22–5.81)
RBC: 5.03 MIL/uL (ref 4.22–5.81)
RDW: 12.3 % (ref 11.5–15.5)
RDW: 12.3 % (ref 11.5–15.5)
WBC: 7 10*3/uL (ref 4.0–10.5)
WBC: 7.3 10*3/uL (ref 4.0–10.5)

## 2015-02-23 LAB — BASIC METABOLIC PANEL
Anion gap: 9 (ref 5–15)
BUN: 12 mg/dL (ref 6–20)
CHLORIDE: 104 mmol/L (ref 101–111)
CO2: 25 mmol/L (ref 22–32)
CREATININE: 1.04 mg/dL (ref 0.61–1.24)
Calcium: 8.9 mg/dL (ref 8.9–10.3)
Glucose, Bld: 107 mg/dL — ABNORMAL HIGH (ref 65–99)
POTASSIUM: 3.6 mmol/L (ref 3.5–5.1)
Sodium: 138 mmol/L (ref 135–145)

## 2015-02-23 LAB — LIPID PANEL
CHOLESTEROL: 167 mg/dL (ref 0–200)
HDL: 83 mg/dL (ref >40)
LDL Cholesterol: 61 mg/dL (ref 0–99)
Total CHOL/HDL Ratio: 2 RATIO
Triglycerides: 116 mg/dL (ref <150)
VLDL: 23 mg/dL (ref 0–40)

## 2015-02-23 LAB — CREATININE, SERUM: Creatinine, Ser: 1.16 mg/dL (ref 0.61–1.24)

## 2015-02-23 MED ORDER — METOPROLOL TARTRATE 25 MG PO TABS: 25.0000 mg | ORAL_TABLET | Freq: Two times a day (BID) | ORAL | Status: DC

## 2015-02-23 MED ORDER — ASPIRIN 81 MG PO TBEC: 81.0000 mg | DELAYED_RELEASE_TABLET | Freq: Every day | ORAL | Status: DC

## 2015-02-23 NOTE — Discharge Summary (Signed)
Physician Discharge Summary  Lester KinsmanRonald E Cl[facility] CenterDur[facility] WUJ:[id number] DOB: 1970-09-30 DOA: 02/22/2015  PCP: No PCP Per Patient  Admit date: 02/22/2015 Discharge date: 02/23/2015  Time spent: 30  Recommendations for Outpatient Follow-up:  1. Follow-up with cardiologist, Dr. Jacinto HalimGanji, within 1 week 2. Follow- up with [facility]   Discharge Diagnoses:  Active Problems:   Chest pain   Essential hypertension   Discharge Condition: Stable  Diet recommendation: Heart healthy diet  Filed Weights   02/22/15 2326  Weight: 50.712 kg (111 lb 12.8 oz)    History of present illness: 44 year old gentleman presented to ED with intermittent chest pain that has persisted over the past year but has been worsening. The chest pain is often right-sided and occasionally left-sided on both the anterior and posterior chest wall. Pain is described as an ache. He also reports some tingling in his fingers of his right and left hands. He notes that the pain was constant prior to arrival, which was why he went to the ED, and there are no alleviating/aggravating factors. He has some numbness throughout his right arm on the anterior and posterior sides. He is not having any neck pain. He has had some SOB and dizziness, no heart palpitations. Family history is questionable, with an aunt who may have had an MI in her 850's. Patient reports a history HTN that he does not treat. He smokes 0.5ppd and has smoked for 20-30 years. He also drinks a 22oz of beer daily.  Hospital Course:  The patient was admitted  The admission heart score was in the 2-3 range and more likely to given his smoking history as well as his untreated hypertension   Symptoms have resolved throughout his hospital stay. He reports one episode of SOB that lasted for a few minutes while he was laying in bed that resolved on its own. Denied chest pain although had tingling down the right arm the patient works 2 jobs and has a fairly active  lifestyle and states that the pain has been coming and going for the past 6 months There was no radiation down the arm although patient had numbness He was completely chest pain-free   MRI demonstrated degenerative disk disease C4-C5 and C6-C7 with stenosis at C4-C5 with C5 impingement. This may explain the tingling in his fingers and numbness throughout his right arm.    Cardiac enzymes have been negative, CXR clear, and EKG has demonstrated normal sinus rhythm.  A repeat EKG on the morning subsequent to admission showed no specific changes concerning for ischemia other than repolarization abnormalities and lead placement issues   His blood pressure has been elevated, so he was placed on metoprolol 25 twice a day for consideration of secondary prevention of arrhythmia and or CAD problems and complications  I have also started him on aspirin 81 mg OTC  I have set him up with Dr. Jeanella CaraJ Ganji cardiology for consideration of an outpatient exercise stress test   Discharge Exam: Filed Vitals:   02/23/15 0547  BP: 134/78  Pulse: 56  Temp: 98.1 F (36.7 C)  Resp: 20    General: Patient is seated in bed in no distress, he is pleasant and interacts appropriately.  Cardiovascular: RRR, no m/r/g Respiratory: Clear to auscultation, no difficulty breathing or pain on deep inspiration. Normal respiratory effort.   Discharge Instructions  Discharge Instructions    Diet - low sodium heart healthy    Complete by:  As directed      Discharge instructions  Complete by:  As directed   Follow with Dr. Jacinto Halim of Cardiology who will set you up fro a possible stress test Pick up your medications     Increase activity slowly    Complete by:  As directed           Current Discharge Medication List    START taking these medications   Details  aspirin EC 81 MG EC tablet Take 1 tablet (81 mg total) by mouth daily.    metoprolol tartrate (LOPRESSOR) 25 MG tablet Take 1 tablet (25 mg total) by  mouth 2 (two) times daily. Qty: 60 tablet, Refills: 1       No Known Allergies Follow-up Information    Call Please follow up with [facility] for community care network referral sent on your behalf .   Why:  Call [facility] at [phone] Tuesday-Friday if you have not heard from them in 3 days after a referral has been made for you [email or web address]   Contact information:   Please use resources provided in [place] [facility] emergency room by case manager to assist with finding a doctor for follow up care and your medications      Follow up with Yates Decamp, MD. Schedule an appointment as soon as possible for a visit in 1 week.   Specialty:  Cardiology   Contact information:   [address] [place] [address] [phone]      The results of significant diagnostics from this hospitalization (including imaging, microbiology, ancillary and laboratory) are listed below for reference.    Significant Diagnostic Studies: Dg Chest 2 View  02/22/2015   CLINICAL DATA:  44 year old male with chest pain  EXAM: CHEST  2 VIEW  COMPARISON:  None.  FINDINGS: The heart size and mediastinal contours are within normal limits. Both lungs are clear. The visualized skeletal structures are unremarkable.  IMPRESSION: No active cardiopulmonary disease.   Electronically Signed   By: Elgie Collard M.D.   On: 02/22/2015 19:16   Mr Cervical Spine Wo Contrast  02/23/2015   CLINICAL DATA:  Neck pain radiating to the right shoulder, arm, and hand with right hand numbness and tingling for 1 year.  EXAM: MRI CERVICAL SPINE WITHOUT CONTRAST  TECHNIQUE: Multiplanar, multisequence MR imaging of the cervical spine was performed. No intravenous contrast was administered.  COMPARISON:  10/19/2013 cervical spine CT  FINDINGS: Motion degraded imaging, especially affecting axial acquisition.  No marrow signal abnormality suggestive of fracture, infection, or neoplasm.  Normal cord signal and morphology.   No extra-spinal findings to explain. Flow related signal loss in the visible cervical and carotid arteries.  Degenerative changes:  C2-3: Unremarkable.  C3-4: Mild disc bulging.  No impingement  C4-5: Mild degenerative disc narrowing with small annular fissure. Spurring, greatest to the right uncovertebral region with advanced foraminal stenosis and C5 impingement. Posterior endplate ridging effaces ventral CSF without cord mass effect.  C5-6: Degenerative disc disease with endplate spurring effacing the ventral CSF. There is no cord mass effect. Uncovertebral spurs without foraminal impingement.  C6-7: Degenerative disc narrowing. Bilateral uncovertebral spurs without foraminal impingement. Mild effacement of ventral CSF without cord mass effect.  C7-T1:Unremarkable.  IMPRESSION: 1. Degenerative disc disease from C4-5 to C6-7. 2. 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   Electronically Signed   By: Marnee Spring M.D.   On: 02/23/2015 07:14   Labs: Basic Metabolic Panel:  Recent Labs Lab 02/22/15 1754 02/22/15 2353 02/23/15 0535  NA  141  --  138  K 4.1  --  3.6  CL 101  --  104  CO2 28  --  25  GLUCOSE 105*  --  107*  BUN 11  --  12  CREATININE 1.26* 1.16 1.04  CALCIUM 10.3  --  8.9   CBC:  Recent Labs Lab 02/22/15 1754 02/22/15 2353 02/23/15 0535  WBC 8.3 7.0 7.3  HGB 18.6* 15.9 16.1  HCT 52.5* 46.2 46.8  MCV 93.4 93.7 93.0  PLT 352 326 308   Cardiac Enzymes:  Recent Labs Lab 02/22/15 2353 02/23/15 0535  TROPONINI <0.03 <0.03    Signed:  Chanda Busing, PA-S    I agree with the History/assesment & plan per Midlevel provider as per above, and independently assessed and discussed the plan of care with the patient, and ammendments were made to above note reflecting my thoughts after careful review of Database, Progress notes, Imaging and Consultant notes Pleas Koch, MD [facility] ([facility]) [phone]  Pleas Koch, MD  [facility] 02/23/2015, 9:25 AM

## 2015-02-23 NOTE — Care Management Note (Signed)
Case Management Note  Patient Details  Name: Vincent Peterson MRN: [id number] Date of Birth: July 17, 1971  Subjective/Objective: 44 y/o m admitted w/chest pain.From home.                   Action/Plan:d/c home no needs or orders.   Expected Discharge Date:   (unknown)               Expected Discharge Plan:  Home/Self Care  In-House Referral:     Discharge planning Services  CM Consult  Post Acute Care Choice:    Choice offered to:     DME Arranged:    DME Agency:     HH Arranged:    HH Agency:     Status of Service:  Completed, signed off  Medicare Important Message Given:    Date Medicare IM Given:    Medicare IM give by:    Date Additional Medicare IM Given:    Additional Medicare Important Message give by:     If discussed at Long Length of Stay Meetings, dates discussed:    Additional Comments:  Lanier ClamMahabir, Silvio Sausedo, RN 02/23/2015, 10:48 AM

## 2015-11-05 ENCOUNTER — Emergency Department (HOSPITAL_COMMUNITY): Payer: No Typology Code available for payment source

## 2015-11-05 ENCOUNTER — Emergency Department (HOSPITAL_COMMUNITY)
Admission: EM | Admit: 2015-11-05 | Discharge: 2015-11-05 | Disposition: A | Discharge status: Home / Self Care | Payer: No Typology Code available for payment source | Attending: Emergency Medicine | Admitting: Emergency Medicine

## 2015-11-05 ENCOUNTER — Encounter (HOSPITAL_COMMUNITY): Payer: Self-pay | Admitting: *Deleted

## 2015-11-05 DIAGNOSIS — F17200 Nicotine dependence, unspecified, uncomplicated: Secondary | ICD-10-CM | POA: Insufficient documentation

## 2015-11-05 DIAGNOSIS — X58XXXA Exposure to other specified factors, initial encounter: Secondary | ICD-10-CM | POA: Insufficient documentation

## 2015-11-05 DIAGNOSIS — S4982XA Other specified injuries of left shoulder and upper arm, initial encounter: Secondary | ICD-10-CM

## 2015-11-05 DIAGNOSIS — Y9289 Other specified places as the place of occurrence of the external cause: Secondary | ICD-10-CM | POA: Insufficient documentation

## 2015-11-05 DIAGNOSIS — Y9389 Activity, other specified: Secondary | ICD-10-CM | POA: Insufficient documentation

## 2015-11-05 DIAGNOSIS — Y998 Other external cause status: Secondary | ICD-10-CM | POA: Insufficient documentation

## 2015-11-05 DIAGNOSIS — S41002A Unspecified open wound of left shoulder, initial encounter: Secondary | ICD-10-CM | POA: Insufficient documentation

## 2015-11-05 DIAGNOSIS — I10 Essential (primary) hypertension: Secondary | ICD-10-CM | POA: Insufficient documentation

## 2015-11-05 DIAGNOSIS — Z7982 Long term (current) use of aspirin: Secondary | ICD-10-CM | POA: Insufficient documentation

## 2015-11-05 MED ORDER — LIDOCAINE-EPINEPHRINE (PF) 2 %-1:200000 IJ SOLN
20.0000 mL | Freq: Once | INTRAMUSCULAR | Status: AC
  Administered 2015-11-05: 20 mL
  Filled 2015-11-05: qty 20

## 2015-11-05 MED ORDER — CEPHALEXIN 500 MG PO CAPS: 500.0000 mg | ORAL_CAPSULE | Freq: Three times a day (TID) | ORAL | Status: DC
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
## 2015-11-05 NOTE — Discharge Instructions (Signed)
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
## 2015-11-05 NOTE — ED Notes (Signed)
[employer] at the bedside 

## 2015-11-05 NOTE — ED Notes (Signed)
Dr Patria Maneampos suturing wound.
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
## 2016-03-04 ENCOUNTER — Encounter (HOSPITAL_COMMUNITY): Payer: Self-pay

## 2016-03-04 ENCOUNTER — Emergency Department (HOSPITAL_COMMUNITY)
Admission: EM | Admit: 2016-03-04 | Discharge: 2016-03-04 | Disposition: A | Discharge status: Home / Self Care | Payer: Self-pay | Attending: Emergency Medicine | Admitting: Emergency Medicine

## 2016-03-04 DIAGNOSIS — Z4802 Encounter for removal of sutures: Secondary | ICD-10-CM | POA: Insufficient documentation

## 2016-03-04 DIAGNOSIS — I10 Essential (primary) hypertension: Secondary | ICD-10-CM | POA: Insufficient documentation

## 2016-03-04 DIAGNOSIS — Z79899 Other long term (current) drug therapy: Secondary | ICD-10-CM | POA: Insufficient documentation

## 2016-03-04 DIAGNOSIS — Z7982 Long term (current) use of aspirin: Secondary | ICD-10-CM | POA: Insufficient documentation

## 2016-03-04 DIAGNOSIS — F17200 Nicotine dependence, unspecified, uncomplicated: Secondary | ICD-10-CM | POA: Insufficient documentation

## 2016-03-04 MED ORDER — BACITRACIN ZINC 500 UNIT/GM EX OINT
1.0000 "application " | TOPICAL_OINTMENT | Freq: Two times a day (BID) | CUTANEOUS | Status: DC
  Administered 2016-03-04: 1 via TOPICAL
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
## 2017-10-17 ENCOUNTER — Other Ambulatory Visit: Payer: Self-pay

## 2017-10-17 ENCOUNTER — Encounter (HOSPITAL_COMMUNITY): Payer: Self-pay | Admitting: Emergency Medicine

## 2017-10-17 ENCOUNTER — Emergency Department (HOSPITAL_COMMUNITY): Payer: Self-pay

## 2017-10-17 ENCOUNTER — Emergency Department (HOSPITAL_COMMUNITY)
Admission: EM | Admit: 2017-10-17 | Discharge: 2017-10-17 | Disposition: A | Discharge status: Home / Self Care | Payer: Self-pay | Attending: Emergency Medicine | Admitting: Emergency Medicine

## 2017-10-17 DIAGNOSIS — F17210 Nicotine dependence, cigarettes, uncomplicated: Secondary | ICD-10-CM | POA: Insufficient documentation

## 2017-10-17 DIAGNOSIS — M5412 Radiculopathy, cervical region: Secondary | ICD-10-CM | POA: Insufficient documentation

## 2017-10-17 DIAGNOSIS — Z7982 Long term (current) use of aspirin: Secondary | ICD-10-CM | POA: Insufficient documentation

## 2017-10-17 DIAGNOSIS — Z79899 Other long term (current) drug therapy: Secondary | ICD-10-CM | POA: Insufficient documentation

## 2017-10-17 LAB — URINALYSIS, ROUTINE W REFLEX MICROSCOPIC
Glucose, UA: NEGATIVE mg/dL
Hgb urine dipstick: NEGATIVE
KETONES UR: NEGATIVE mg/dL
Leukocytes, UA: NEGATIVE
NITRITE: NEGATIVE
PROTEIN: NEGATIVE mg/dL
Specific Gravity, Urine: 1.029 (ref 1.005–1.030)
pH: 6 (ref 5.0–8.0)

## 2017-10-17 MED ORDER — CYCLOBENZAPRINE HCL 10 MG PO TABS: 10.0000 mg | ORAL_TABLET | Freq: Two times a day (BID) | ORAL | 0 refills | Status: DC | PRN

## 2017-10-17 MED ORDER — NAPROXEN 500 MG PO TABS: 500.0000 mg | ORAL_TABLET | Freq: Two times a day (BID) | ORAL | 0 refills | Status: DC

## 2017-10-17 MED ORDER — PREDNISONE 20 MG PO TABS: 40.0000 mg | ORAL_TABLET | Freq: Every day | ORAL | 0 refills | Status: DC
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
## 2017-10-17 NOTE — ED Notes (Signed)
Pt. To XRAY via stretcher. 

## 2017-10-17 NOTE — Discharge Instructions (Signed)
Take naprosyn for pain. Flexeril for spasms. Prednisone for inflammation until all gone. Please follow up with family doctor if not improving. Your urine cultures are pending and if come back abnormal we will call you, you can also look up results on my chart.

## 2017-10-17 NOTE — ED Provider Notes (Signed)
[facility] EMERGENCY DEPARTMENT Provider Note   CSN: [id number] Arrival date & time: 10/17/17  1616     History   Chief Complaint Chief Complaint  Patient presents with  . Shoulder Pain    HPI Vincent Peterson is a 47 y.o. male.  HPI Vincent Peterson is a 47 y.o. male presents to emergency department with 2 separate complaints.  Patient is complaining of pain to the right side of the neck, right arm, and tingling to the right fingertips.  He states symptoms for about 3 weeks.  He denies any injuries.  He states movement makes his symptoms worse,  nothing makes it better.  He is also complaining of some associated chest pain that is worse when he brings his arms up.  Patient is also complaining of penile discharge.  Denies any dysuria, hematuria, urinary frequency or urgency.  Patient is accompanied here by his girlfriend who states "he needs to be tested."  No fever, chills.  He does smoke.  No IV drug use.  Past Medical History:  Diagnosis Date  . Hypertension     Patient Active Problem List   Diagnosis Date Noted  . Essential hypertension 02/23/2015  . Chest pain 02/22/2015    History reviewed. No pertinent surgical history.     Home Medications    Prior to Admission medications   Medication Sig Start Date End Date Taking? Authorizing Provider  aspirin EC 81 MG EC tablet Take 1 tablet (81 mg total) by mouth daily. 02/23/15   Rhetta Mura, MD  cephALEXin (KEFLEX) 500 MG capsule Take 1 capsule (500 mg total) by mouth 3 (three) times daily. 11/05/15   Melton Krebs, PA-C  metoprolol tartrate (LOPRESSOR) 25 MG tablet Take 1 tablet (25 mg total) by mouth 2 (two) times daily. Patient taking differently: Take 25 mg by mouth daily.  02/23/15   Rhetta Mura, MD    Family History History reviewed. No pertinent family history.  Social History Social History   Tobacco Use  . Smoking status: Current Every Day Smoker  . Smokeless  tobacco: Never Used  Substance Use Topics  . Alcohol use: Yes    Comment: socially, weekends  . Drug use: No     Allergies   Patient has no known allergies.   Review of Systems Review of Systems  Constitutional: Negative for chills and fever.  Respiratory: Positive for chest tightness. Negative for cough and shortness of breath.   Cardiovascular: Positive for chest pain. Negative for palpitations and leg swelling.  Gastrointestinal: Negative for abdominal distention, abdominal pain, diarrhea, nausea and vomiting.  Genitourinary: Negative for dysuria, frequency, hematuria and urgency.  Musculoskeletal: Positive for arthralgias and neck pain. Negative for myalgias and neck stiffness.  Skin: Negative for rash.  Allergic/Immunologic: Negative for immunocompromised state.  Neurological: Positive for numbness. Negative for dizziness, weakness, light-headedness and headaches.  All other systems reviewed and are negative.    Physical Exam Updated Vital Signs BP (!) 162/99   Pulse 61   Temp 97.8 F (36.6 C) (Oral)   Resp 18   Ht 5\' 5"  (1.651 m)   Wt 59 kg (130 lb)   SpO2 96%   BMI 21.63 kg/m   Physical Exam  Constitutional: He appears well-developed and well-nourished. No distress.  HENT:  Head: Normocephalic and atraumatic.  Eyes: Conjunctivae are normal.  Neck: Neck supple.  No midline tenderness, full range of motion of the head  Cardiovascular: Normal rate, regular rhythm and normal heart  sounds.  Pulmonary/Chest: Effort normal. No respiratory distress. He has no wheezes. He has no rales.  Abdominal: Soft. Bowel sounds are normal. He exhibits no distension. There is no tenderness. There is no rebound.  Musculoskeletal: He exhibits no edema.  Full range of motion of the right shoulder with no pain.  Neurological: He is alert.  5 out of 5  and equal bilateral grip strength, biceps strength, tricep strength, deltoid strength.  Distal radial pulses intact and equal  bilaterally.  Skin: Skin is warm and dry.  Nursing note and vitals reviewed.    ED Treatments / Results  Labs (all labs ordered are listed, but only abnormal results are displayed) Labs Reviewed  URINALYSIS, ROUTINE W REFLEX MICROSCOPIC - Abnormal; Notable for the following components:      Result Value   Bilirubin Urine SMALL (*)    All other components within normal limits  GC/CHLAMYDIA PROBE AMP ([place]) NOT AT [facility]    EKG  EKG Interpretation None       Radiology Dg Chest 2 View  Result Date: 10/17/2017 CLINICAL DATA:  Two weeks of right shoulder pain causing numbness and tingling down right arm. EXAM: CHEST - 2 VIEW COMPARISON:  11/05/2015 FINDINGS: The heart size and mediastinal contours are within normal limits. Both lungs are clear. 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 IMPRESSION: No active cardiopulmonary disease. AC joint osteoarthritis with undersurface spurring which may cause impingement type symptoms with shoulder abduction. Electronically Signed   By: Tollie Eth M.D.   On: 10/17/2017 22:01   Dg Cervical Spine Complete  Result Date: 10/17/2017 CLINICAL DATA:  Subacute onset of right shoulder pain, and numbness and tingling down the right arm. Right-sided neck pain. EXAM: CERVICAL SPINE - COMPLETE 4+ VIEW COMPARISON:  MRI of the cervical spine performed 02/23/2015 FINDINGS: There is no evidence of fracture or subluxation. Vertebral bodies demonstrate normal height and alignment. Minimal intervertebral disc space narrowing is noted along the lower cervical spine, with small anterior and posterior disc osteophyte complexes. Prevertebral soft tissues are within normal limits. The provided odontoid view demonstrates no significant abnormality. The visualized lung apices are clear. IMPRESSION: 1. No evidence of fracture or subluxation along the cervical spine. 2. Minimal degenerative change along the lower cervical spine. Electronically  Signed   By: Roanna Raider M.D.   On: 10/17/2017 22:01    Procedures Procedures (including critical care time)  Medications Ordered in ED Medications - No data to display   Initial Impression / Assessment and Plan / ED Course  I have reviewed the triage vital signs and the nursing notes.  Pertinent labs & imaging results that were available during my care of the patient were reviewed by me and considered in my medical decision making (see chart for details).     Patient in emergency department with symptoms and signs concerning for cervical radiculopathy.  X-ray of the neck obtained showing arthritis.  He is also complaining of some musculoskeletal chest pain, chest x-ray unremarkable.  Urinalysis negative.  Culture sent for gonorrhea and chlamydia, and pending.  All results were discussed with patient.  Will treat with NSAIDs, steroids, muscle relaxants, follow-up with PCP.  He is neurovascularly intact, normal strength and sensation in the arm, doubt CVA.  Return precautions discussed.   Vitals:   10/17/17 1626  BP: (!) 162/99  Pulse: 61  Resp: 18  Temp: 97.8 F (36.6 C)  SpO2: 96%     Final Clinical Impressions(s) / ED  Diagnoses   Final diagnoses:  Cervical radiculopathy    ED Discharge Orders        Ordered    naproxen (NAPROSYN) 500 MG tablet  2 times daily     10/17/17 2209    predniSONE (DELTASONE) 20 MG tablet  Daily     10/17/17 2209    cyclobenzaprine (FLEXERIL) 10 MG tablet  2 times daily PRN     10/17/17 2209       Jaynie CrumbleKirichenko, Jacelyn Cuen, Cordelia Poche-C 10/17/17 2212    Tilden Fossaees, Elizabeth, MD 10/22/17 1207

## 2017-10-18 LAB — GC/CHLAMYDIA PROBE AMP (~~LOC~~) NOT AT ARMC
Chlamydia: NEGATIVE
NEISSERIA GONORRHEA: NEGATIVE

## 2018-09-12 ENCOUNTER — Encounter (HOSPITAL_COMMUNITY): Payer: Self-pay

## 2018-09-12 ENCOUNTER — Emergency Department (HOSPITAL_COMMUNITY)
Admission: EM | Admit: 2018-09-12 | Discharge: 2018-09-12 | Disposition: A | Discharge status: Home / Self Care | Payer: Self-pay | Attending: Emergency Medicine | Admitting: Emergency Medicine

## 2018-09-12 ENCOUNTER — Other Ambulatory Visit: Payer: Self-pay

## 2018-09-12 DIAGNOSIS — J111 Influenza due to unidentified influenza virus with other respiratory manifestations: Secondary | ICD-10-CM | POA: Insufficient documentation

## 2018-09-12 DIAGNOSIS — Z79899 Other long term (current) drug therapy: Secondary | ICD-10-CM | POA: Insufficient documentation

## 2018-09-12 DIAGNOSIS — F17210 Nicotine dependence, cigarettes, uncomplicated: Secondary | ICD-10-CM | POA: Insufficient documentation

## 2018-09-12 DIAGNOSIS — I10 Essential (primary) hypertension: Secondary | ICD-10-CM | POA: Insufficient documentation

## 2018-09-12 DIAGNOSIS — R69 Illness, unspecified: Secondary | ICD-10-CM

## 2018-09-12 MED ORDER — BENZONATATE 100 MG PO CAPS: 100.0000 mg | ORAL_CAPSULE | Freq: Three times a day (TID) | ORAL | 0 refills | Status: DC

## 2018-09-12 MED ORDER — IBUPROFEN 200 MG PO TABS
600.0000 mg | ORAL_TABLET | Freq: Once | ORAL | Status: AC
  Administered 2018-09-12: 600 mg via ORAL
  Filled 2018-09-12: qty 3

## 2018-09-12 NOTE — ED Provider Notes (Signed)
[facility]-EMERGENCY DEPT Provider Note   CSN: [id number] Arrival date & time: 09/12/18  1407     History   Chief Complaint Chief Complaint  Patient presents with  . Generalized Body Aches    HPI Vincent Peterson is a 48 y.o. male past medical history of hypertension, presenting the emergency department with 3 days of flulike symptoms.  Patient states symptoms initially began with a dry cough.  He states the next day he developed muscle aches and weakness.  He had diarrhea as well with some associated abdominal cramping, however this is improved.  He has small amount of nausea without vomiting.  He has subjective fevers and headaches as well.  Denies sore throat, congestion, rhinorrhea.  No recent out of country travel.  He is treated his symptoms with daytime flu medication yesterday.  He had Tylenol around 1230 to 1 AM this morning.  Symptoms have not been worsening, however they are not improving.  The history is provided by the patient.    Past Medical History:  Diagnosis Date  . Hypertension     Patient Active Problem List   Diagnosis Date Noted  . Essential hypertension 02/23/2015  . Chest pain 02/22/2015    History reviewed. No pertinent surgical history.      Home Medications    Prior to Admission medications   Medication Sig Start Date End Date Taking? Authorizing Provider  aspirin EC 81 MG EC tablet Take 1 tablet (81 mg total) by mouth daily. Patient not taking: Reported on 09/12/2018 02/23/15   Rhetta Mura, MD  benzonatate (TESSALON) 100 MG capsule Take 1 capsule (100 mg total) by mouth every 8 (eight) hours. 09/12/18   Nalea Salce, [place] N, PA-C  cephALEXin (KEFLEX) 500 MG capsule Take 1 capsule (500 mg total) by mouth 3 (three) times daily. Patient not taking: Reported on 09/12/2018 11/05/15   Melton Krebs, PA-C  cyclobenzaprine (FLEXERIL) 10 MG tablet Take 1 tablet (10 mg total) by mouth 2 (two) times daily as needed for muscle  spasms. Patient not taking: Reported on 09/12/2018 10/17/17   Jaynie Crumble, PA-C  metoprolol tartrate (LOPRESSOR) 25 MG tablet Take 1 tablet (25 mg total) by mouth 2 (two) times daily. Patient not taking: Reported on 09/12/2018 02/23/15   Rhetta Mura, MD  naproxen (NAPROSYN) 500 MG tablet Take 1 tablet (500 mg total) by mouth 2 (two) times daily. Patient not taking: Reported on 09/12/2018 10/17/17   Jaynie Crumble, PA-C  predniSONE (DELTASONE) 20 MG tablet Take 2 tablets (40 mg total) by mouth daily. Patient not taking: Reported on 09/12/2018 10/17/17   Jaynie Crumble, PA-C    Family History No family history on file.  Social History Social History   Tobacco Use  . Smoking status: Current Every Day Smoker  . Smokeless tobacco: Never Used  Substance Use Topics  . Alcohol use: Yes    Comment: socially, weekends  . Drug use: No     Allergies   Patient has no known allergies.   Review of Systems Review of Systems  Constitutional: Positive for chills and fever.  HENT: Positive for sore throat. Negative for congestion, rhinorrhea, trouble swallowing and voice change.   Respiratory: Positive for cough.   Gastrointestinal: Positive for diarrhea and nausea. Negative for vomiting.     Physical Exam Updated Vital Signs BP 127/84 (BP Location: Right Arm)   Pulse 93   Temp 98.2 F (36.8 C) (Oral)   Resp 13   Ht 5\' 4"  (1.626  m)   Wt 60.3 kg   SpO2 94%   BMI 22.83 kg/m   Physical Exam Vitals signs and nursing note reviewed.  Constitutional:      Appearance: He is well-developed. He is not toxic-appearing.  HENT:     Head: Normocephalic and atraumatic.     Right Ear: Tympanic membrane and ear canal normal.     Left Ear: Tympanic membrane and ear canal normal.     Nose: Nose normal.     Mouth/Throat:     Mouth: Mucous membranes are moist.     Pharynx: Oropharynx is clear.  Eyes:     Conjunctiva/sclera: Conjunctivae normal.  Neck:     Musculoskeletal:  Normal range of motion and neck supple.  Cardiovascular:     Rate and Rhythm: Normal rate and regular rhythm.  Pulmonary:     Effort: Pulmonary effort is normal. No respiratory distress.     Breath sounds: Normal breath sounds. No stridor.  Abdominal:     General: Abdomen is flat. Bowel sounds are normal. There is no distension.     Tenderness: There is no abdominal tenderness. There is no guarding or rebound.  Lymphadenopathy:     Cervical: No cervical adenopathy.  Neurological:     Mental Status: He is alert.  Psychiatric:        Mood and Affect: Mood normal.        Behavior: Behavior normal.      ED Treatments / Results  Labs (all labs ordered are listed, but only abnormal results are displayed) Labs Reviewed - No data to display  EKG None  Radiology No results found.  Procedures Procedures (including critical care time)  Medications Ordered in ED Medications  ibuprofen (ADVIL,MOTRIN) tablet 600 mg (has no administration in time range)     Initial Impression / Assessment and Plan / ED Course  I have reviewed the triage vital signs and the nursing notes.  Pertinent labs & imaging results that were available during my care of the patient were reviewed by me and considered in my medical decision making (see chart for details).     Patient with symptoms consistent with influenza vs viral illness.  Vitals are stable, afebrile.  No signs of dehydration, tolerating PO's.  Lungs are clear. Due to patient's presentation and physical exam a chest x-ray was not ordered bc likely diagnosis of flu.  Discussed the cost versus benefit of Tamiflu treatment with the patient.  The patient understands that symptoms are greater than the recommended 24-48 hour window of treatment.  Patient will be discharged with instructions to orally hydrate, rest, and use over-the-counter medications such as anti-inflammatories for muscle aches and Tylenol for fever.  Patient will also be given a  cough suppressant. Agreeable to plan and safe for discharge.  Discussed results, findings, treatment and follow up. Patient advised of return precautions. Patient verbalized understanding and agreed with plan.'  Final Clinical Impressions(s) / ED Diagnoses   Final diagnoses:  Influenza-like illness    ED Discharge Orders         Ordered    benzonatate (TESSALON) 100 MG capsule  Every 8 hours     09/12/18 1536           Lashaunta Sicard, [place] N, [place] 09/12/18 1536    Long, Arlyss Repress, MD 09/12/18 1753

## 2018-09-12 NOTE — Discharge Instructions (Addendum)
Please read instructions below.  You can take tylenol and alternate with ibuprofen every 4 hours for body aches, headache, and fever.  Drink plenty of water.  Use saline nasal spray for congestion. You can take tessalon every 8 hours for cough. Follow up with your primary care provider.  Return to the ER for inability to swallow liquids, difficulty breathing, or new or worsening symptoms.

## 2018-09-12 NOTE — ED Triage Notes (Signed)
Pt BIBA from home. Pt has had flu like symptoms x 3 days. Body aches, nausea, fever, headache, emesis No meds PTA. VSS with EMS

## 2020-01-14 ENCOUNTER — Emergency Department
Admission: EM | Admit: 2020-01-14 | Discharge: 2020-01-14 | Disposition: A | Discharge status: Home / Self Care | Payer: Medicaid Other | Attending: Emergency Medicine | Admitting: Emergency Medicine

## 2020-01-14 ENCOUNTER — Other Ambulatory Visit: Payer: Self-pay

## 2020-01-14 DIAGNOSIS — F17200 Nicotine dependence, unspecified, uncomplicated: Secondary | ICD-10-CM | POA: Insufficient documentation

## 2020-01-14 DIAGNOSIS — R369 Urethral discharge, unspecified: Secondary | ICD-10-CM

## 2020-01-14 DIAGNOSIS — Z113 Encounter for screening for infections with a predominantly sexual mode of transmission: Secondary | ICD-10-CM | POA: Insufficient documentation

## 2020-01-14 DIAGNOSIS — I10 Essential (primary) hypertension: Secondary | ICD-10-CM | POA: Insufficient documentation

## 2020-01-14 MED ORDER — CEFTRIAXONE SODIUM 1 G IJ SOLR
500.0000 mg | Freq: Once | INTRAMUSCULAR | Status: AC
  Administered 2020-01-14: 500 mg via INTRAMUSCULAR
  Filled 2020-01-14: qty 10

## 2020-01-14 MED ORDER — LIDOCAINE HCL (PF) 1 % IJ SOLN
2.1000 mL | Freq: Once | INTRAMUSCULAR | Status: AC
Start: 2020-01-14 — End: 2020-01-14
  Administered 2020-01-14: 2.1 mL
  Filled 2020-01-14: qty 5

## 2020-01-14 MED ORDER — AZITHROMYCIN 500 MG PO TABS
1000.0000 mg | ORAL_TABLET | Freq: Every day | ORAL | Status: DC
  Administered 2020-01-14: 1000 mg via ORAL
  Filled 2020-01-14: qty 2

## 2020-01-14 NOTE — ED Notes (Signed)
Pt agrees to stay post IM shot. Will d/c if no reaction.

## 2020-01-14 NOTE — ED Triage Notes (Signed)
Pt here for penile discharge that started 2-3 days ago. Itching, burning, and discharge.

## 2020-01-14 NOTE — ED Notes (Signed)
See triage note. Pt alert and resting calmly in bed. Visitor at bedside.

## 2020-01-18 NOTE — ED Provider Notes (Signed)
[facility] Emergency Department Provider Note  ____________________________________________  Time seen: Approximately 4:51 PM  I have reviewed the triage vital signs and the nursing notes.   HISTORY  Chief Complaint Penile Discharge    HPI Vincent Peterson is a 49 y.o. male presents to the emergency department for evaluation of penile discharge and burning with urination.  Symptoms started 2 or 3 days ago.  No abdominal pain or fever.  No alleviating measures attempted prior to arrival.   Past Medical History:  Diagnosis Date   Hypertension     Patient Active Problem List   Diagnosis Date Noted   Essential hypertension 02/23/2015   Chest pain 02/22/2015    No past surgical history on file.  Prior to Admission medications   Medication Sig Start Date End Date Taking? Authorizing Provider  aspirin EC 81 MG EC tablet Take 1 tablet (81 mg total) by mouth daily. Patient not taking: Reported on 09/12/2018 02/23/15   Nita Sells, MD  benzonatate (TESSALON) 100 MG capsule Take 1 capsule (100 mg total) by mouth every 8 (eight) hours. 09/12/18   Robinson, [place] N, PA-C  cephALEXin (KEFLEX) 500 MG capsule Take 1 capsule (500 mg total) by mouth 3 (three) times daily. Patient not taking: Reported on 09/12/2018 11/05/15   [place] Lions, PA-C  cyclobenzaprine (FLEXERIL) 10 MG tablet Take 1 tablet (10 mg total) by mouth 2 (two) times daily as needed for muscle spasms. Patient not taking: Reported on 09/12/2018 10/17/17   Jeannett Senior, PA-C  metoprolol tartrate (LOPRESSOR) 25 MG tablet Take 1 tablet (25 mg total) by mouth 2 (two) times daily. Patient not taking: Reported on 09/12/2018 02/23/15   Nita Sells, MD  naproxen (NAPROSYN) 500 MG tablet Take 1 tablet (500 mg total) by mouth 2 (two) times daily. Patient not taking: Reported on 09/12/2018 10/17/17   Jeannett Senior, PA-C  predniSONE (DELTASONE) 20 MG tablet Take 2 tablets (40 mg total)  by mouth daily. Patient not taking: Reported on 09/12/2018 10/17/17   Jeannett Senior, PA-C    Allergies Patient has no known allergies.  No family history on file.  Social History Social History   Tobacco Use   Smoking status: Current Every Day Smoker   Smokeless tobacco: Never Used  Substance Use Topics   Alcohol use: Yes    Comment: socially, weekends   Drug use: No    Review of Systems Constitutional: Negative for fever. Respiratory: Negative for shortness of breath or cough. Gastrointestinal: Negative for abdominal pain; negative for nausea , negative for vomiting. Genitourinary: Positive for dysuria , positive for penile discharge. Musculoskeletal: Negative for back pain. Skin: Negative for acute skin changes/rash/lesion. ____________________________________________   PHYSICAL EXAM:  VITAL SIGNS: ED Triage Vitals  Enc Vitals Group     BP 01/14/20 1828 (!) 180/95     Pulse Rate 01/14/20 1828 62     Resp 01/14/20 1828 18     Temp 01/14/20 1828 98.5 F (36.9 C)     Temp Source 01/14/20 1828 Oral     SpO2 01/14/20 1828 99 %     Weight --      Height --      Head Circumference --      Peak Flow --      Pain Score 01/14/20 1839 6     Pain Loc --      Pain Edu? --      Excl. in [place]? --     Constitutional: Alert and oriented.  Well appearing and in no acute distress. Eyes: Conjunctivae are normal. Head: Atraumatic. Nose: No congestion/rhinnorhea. Mouth/Throat: Mucous membranes are moist. Respiratory: Normal respiratory effort.  No retractions. Gastrointestinal: Bowel sounds active x 4; Abdomen is soft without rebound or guarding. Genitourinary: Penile discharge without lesions Musculoskeletal: No extremity tenderness nor edema.  Neurologic:  Normal speech and language. No gross focal neurologic deficits are appreciated. Speech is normal. No gait instability. Skin:  Skin is warm, dry and intact. No rash noted on exposed skin. Psychiatric: Mood and  affect are normal. Speech and behavior are normal.  ____________________________________________   LABS (all labs ordered are listed, but only abnormal results are displayed)  Labs Reviewed - No data to display ____________________________________________  RADIOLOGY  Not indicated ____________________________________________  Procedures  ____________________________________________  49 year old male presenting to the emergency department for treatment of STD.  See HPI for further details.  He will be given azithromycin and Rocephin.  He was advised to avoid sexual intercourse for at least a week.  He needs to follow-up with the health department in about 2 weeks to make sure that he is completely cleared.  He is to return to the emergency department for symptoms of change or worsen if unable to schedule an appointment.  INITIAL IMPRESSION / ASSESSMENT AND PLAN / ED COURSE  Pertinent labs & imaging results that were available during my care of the patient were reviewed by me and considered in my medical decision making (see chart for details).  ____________________________________________   FINAL CLINICAL IMPRESSION(S) / ED DIAGNOSES  Final diagnoses:  Penile discharge  Screen for STD (sexually transmitted disease)    Note:  This document was prepared using Dragon voice recognition software and may include unintentional dictation errors.   Chinita Pester, FNP 01/18/20 1653    Sharman Cheek, MD 01/25/20 2016

## 2020-04-18 ENCOUNTER — Ambulatory Visit: Payer: Medicaid Other

## 2021-04-18 ENCOUNTER — Ambulatory Visit: Payer: Self-pay | Admitting: Physician Assistant

## 2021-04-18 ENCOUNTER — Other Ambulatory Visit: Payer: Self-pay

## 2021-04-18 DIAGNOSIS — Z113 Encounter for screening for infections with a predominantly sexual mode of transmission: Secondary | ICD-10-CM

## 2021-04-18 DIAGNOSIS — Z202 Contact with and (suspected) exposure to infections with a predominantly sexual mode of transmission: Secondary | ICD-10-CM

## 2021-04-18 MED ORDER — CEFTRIAXONE SODIUM 500 MG IJ SOLR
500.0000 mg | Freq: Once | INTRAMUSCULAR | Status: AC
  Administered 2021-04-18: 500 mg via INTRAMUSCULAR

## 2021-04-18 MED ORDER — AZITHROMYCIN 500 MG PO TABS
1000.0000 mg | ORAL_TABLET | Freq: Once | ORAL | Status: AC
  Administered 2021-04-18: 1000 mg via ORAL

## 2021-04-20 ENCOUNTER — Encounter: Payer: Self-pay | Admitting: Physician Assistant

## 2021-04-20 NOTE — Progress Notes (Signed)
   [facility] Department STI clinic/screening visit  Subjective:  Vincent Peterson is a 50 y.o. male being seen today for an STI screening visit. The patient reports they do have symptoms.    Patient has the following medical conditions:   Patient Active Problem List   Diagnosis Date Noted   Essential hypertension 02/23/2015   Chest pain 02/22/2015     Chief Complaint  Patient presents with   SEXUALLY TRANSMITTED DISEASE    screening    HPI  Patient reports that he is having sensitivity at the tip of his penis off and on for 2-3 weeks and is also a contact to [facility].  Reports that he has HTN and takes medicines as prescribed by his PCP.  Denies other chronic conditions and surgeries.  States last HIV test was years ago and last void was over 2 hr ago.  Screening for MPX risk: Does the patient have an unexplained rash? No Is the patient MSM? No Does the patient endorse multiple sex partners or anonymous sex partners? No Did the patient have close or sexual contact with a person diagnosed with MPX? No Has the patient traveled outside the [place] where MPX is endemic? No Is there a high clinical suspicion for MPX-- evidenced by one of the following No  -Unlikely to be chickenpox  -Lymphadenopathy  -Rash that present in same phase of evolution on any given body part   See flowsheet for further details and programmatic requirements.    The following portions of the patient's history were reviewed and updated as appropriate: allergies, current medications, past medical history, past social history, past surgical history and problem list.  Objective:  There were no vitals filed for this visit.  Physical Exam Constitutional:      General: He is not in acute distress.    Appearance: Normal appearance.  HENT:     Head: Normocephalic and atraumatic.  Eyes:     Conjunctiva/sclera: Conjunctivae normal.  Pulmonary:     Effort: Pulmonary effort is normal.  Skin:    General:  Skin is warm and dry.  Neurological:     Mental Status: He is alert and oriented to person, place, and time.  Psychiatric:        Mood and Affect: Mood normal.        Behavior: Behavior normal.        Thought Content: Thought content normal.        Judgment: Judgment normal.      Assessment and Plan:  Vincent Peterson is a 50 y.o. male presenting to the [facility] Department for STI screening  1. Screening for STD (sexually transmitted disease) Patient into clinic with symptoms. Patient declines screening exam and testing today.  Requests treatment only.  Rec condoms with all sex.   2. Gonorrhea contact Will treat as a contact to [facility] and cover for Chlamydia with Ceftriaxone 500 mg IM and Azithromycin 1 po DOT today. No sex for 14 days and until after partner completes treatment.  Call and RTC for re-treatment if has vomiting < 2 hr after taking medicine. - cefTRIAXone (ROCEPHIN) injection 500 mg - azithromycin (ZITHROMAX) tablet 1,000 mg     No follow-ups on file.  No future appointments.  Matt Holmes, PA

## 2021-08-08 ENCOUNTER — Ambulatory Visit: Payer: Medicaid Other

## 2021-08-30 ENCOUNTER — Ambulatory Visit: Payer: Self-pay | Admitting: Family Medicine

## 2021-08-30 ENCOUNTER — Encounter: Payer: Self-pay | Admitting: Family Medicine

## 2021-08-30 ENCOUNTER — Other Ambulatory Visit: Payer: Self-pay

## 2021-08-30 DIAGNOSIS — Z113 Encounter for screening for infections with a predominantly sexual mode of transmission: Secondary | ICD-10-CM

## 2021-08-30 DIAGNOSIS — Z202 Contact with and (suspected) exposure to infections with a predominantly sexual mode of transmission: Secondary | ICD-10-CM

## 2021-08-30 LAB — HM HIV SCREENING LAB: HM HIV Screening: NEGATIVE

## 2021-08-30 MED ORDER — METRONIDAZOLE 500 MG PO TABS: 500.0000 mg | ORAL_TABLET | Freq: Two times a day (BID) | ORAL | 0 refills | Status: AC

## 2021-08-30 NOTE — Progress Notes (Signed)
[facility] Department STI clinic/screening visit  Subjective:  Vincent Peterson is a 51 y.o. male being seen today for an STI screening visit. The patient reports they do have symptoms.    Patient has the following medical conditions:   Patient Active Problem List   Diagnosis Date Noted   Essential hypertension 02/23/2015   Chest pain 02/22/2015     Chief Complaint  Patient presents with   SEXUALLY TRANSMITTED DISEASE    screening    HPI  Patient reports here for screening because partner made him.  Reports "I really don't have symptoms "  Does the patient or their partner desires a pregnancy in the next year? No  Screening for MPX risk: Does the patient have an unexplained rash? No Is the patient MSM? No Does the patient endorse multiple sex partners or anonymous sex partners? No Did the patient have close or sexual contact with a person diagnosed with MPX? No Has the patient traveled outside the [place] where MPX is endemic? No Is there a high clinical suspicion for MPX-- evidenced by one of the following No  -Unlikely to be chickenpox  -Lymphadenopathy  -Rash that present in same phase of evolution on any given body part   See flowsheet for further details and programmatic requirements.    The following portions of the patient's history were reviewed and updated as appropriate: allergies, current medications, past medical history, past social history, past surgical history and problem list.  Objective:  There were no vitals filed for this visit.  Physical Exam Constitutional:      Appearance: Normal appearance.  HENT:     Head: Normocephalic.     Mouth/Throat:     Mouth: Mucous membranes are moist.     Pharynx: Oropharynx is clear. No oropharyngeal exudate.  Pulmonary:     Effort: Pulmonary effort is normal.  Genitourinary:    Comments: Deferred- pt declined  Musculoskeletal:     Cervical back: Normal range of motion and neck supple.   Lymphadenopathy:     Cervical: No cervical adenopathy.  Skin:    General: Skin is warm and dry.     Findings: No bruising, erythema, lesion or rash.  Neurological:     Mental Status: He is alert.  Psychiatric:        Mood and Affect: Mood normal.        Behavior: Behavior normal.      Assessment and Plan:  Vincent Peterson is a 51 y.o. male presenting to the [facility] for STI screening  1. Screening examination for venereal disease Patient does have STI symptoms Patient DECLINED all screenings including  gram stain,  oral, urethral GC and ACCEPTED bloodwork for HIV/RPR.  Patient meets criteria for HepB screening? Yes. Ordered? No - declined  Patient meets criteria for HepC screening? Yes. Ordered? No - declined  Recommended condom use with all sex Discussed importance of condom use for STI prevent  Discussed time line for [facility] results and that patient will be called with positive results and encouraged patient to call if he had not heard in 2 weeks Recommended returning for continued or worsening symptoms.  - [facility] - Syphilis Serology, [facility]  2. Exposure to trichomonas Partner was positive for Trich. Patient had contact card.  - metroNIDAZOLE (FLAGYL) 500 MG tablet; Take 1 tablet (500 mg total) by mouth 2 (two) times daily for 7 days.  Dispense: 14 tablet; Refill: 0  No follow-ups on file.  No future appointments.  Wendi Snipes, FNP

## 2021-08-30 NOTE — Progress Notes (Signed)
Patient here for STD testing. Patient is a contact to trich. Medication given, and charted. Patient instructed to not have sex for 14 days. Condoms given. Trich pamphlet given.

## 2021-09-20 ENCOUNTER — Other Ambulatory Visit: Payer: Self-pay

## 2021-09-20 ENCOUNTER — Encounter: Payer: Self-pay | Admitting: Emergency Medicine

## 2021-09-20 ENCOUNTER — Emergency Department: Payer: Self-pay

## 2021-09-20 ENCOUNTER — Inpatient Hospital Stay
Admission: EM | Admit: 2021-09-20 | Discharge: 2021-09-22 | DRG: 157 | Disposition: A | Discharge status: Home / Self Care | Payer: Self-pay | Attending: Internal Medicine | Admitting: Internal Medicine

## 2021-09-20 DIAGNOSIS — U071 COVID-19: Secondary | ICD-10-CM | POA: Diagnosis present

## 2021-09-20 DIAGNOSIS — F17210 Nicotine dependence, cigarettes, uncomplicated: Secondary | ICD-10-CM | POA: Diagnosis present

## 2021-09-20 DIAGNOSIS — K122 Cellulitis and abscess of mouth: Principal | ICD-10-CM | POA: Diagnosis present

## 2021-09-20 DIAGNOSIS — I10 Essential (primary) hypertension: Secondary | ICD-10-CM | POA: Diagnosis present

## 2021-09-20 LAB — CBC WITH DIFFERENTIAL/PLATELET
Abs Immature Granulocytes: 0.03 10*3/uL (ref 0.00–0.07)
Basophils Absolute: 0.1 10*3/uL (ref 0.0–0.1)
Basophils Relative: 1 %
Eosinophils Absolute: 0.7 10*3/uL — ABNORMAL HIGH (ref 0.0–0.5)
Eosinophils Relative: 5 %
HCT: 42.3 % (ref 39.0–52.0)
Hemoglobin: 14.6 g/dL (ref 13.0–17.0)
Immature Granulocytes: 0 %
Lymphocytes Relative: 22 %
Lymphs Abs: 2.8 10*3/uL (ref 0.7–4.0)
MCH: 32.4 pg (ref 26.0–34.0)
MCHC: 34.5 g/dL (ref 30.0–36.0)
MCV: 94 fL (ref 80.0–100.0)
Monocytes Absolute: 1 10*3/uL (ref 0.1–1.0)
Monocytes Relative: 8 %
Neutro Abs: 7.9 10*3/uL — ABNORMAL HIGH (ref 1.7–7.7)
Neutrophils Relative %: 64 %
Platelets: 326 10*3/uL (ref 150–400)
RBC: 4.5 MIL/uL (ref 4.22–5.81)
RDW: 12.2 % (ref 11.5–15.5)
WBC: 12.4 10*3/uL — ABNORMAL HIGH (ref 4.0–10.5)
nRBC: 0 % (ref 0.0–0.2)

## 2021-09-20 LAB — COMPREHENSIVE METABOLIC PANEL
ALT: 12 U/L (ref 0–44)
AST: 19 U/L (ref 15–41)
Albumin: 4 g/dL (ref 3.5–5.0)
Alkaline Phosphatase: 36 U/L — ABNORMAL LOW (ref 38–126)
Anion gap: 7 (ref 5–15)
BUN: 13 mg/dL (ref 6–20)
CO2: 27 mmol/L (ref 22–32)
Calcium: 8.9 mg/dL (ref 8.9–10.3)
Chloride: 104 mmol/L (ref 98–111)
Creatinine, Ser: 1.07 mg/dL (ref 0.61–1.24)
GFR, Estimated: >60 mL/min (ref >60)
Glucose, Bld: 105 mg/dL — ABNORMAL HIGH (ref 70–99)
Potassium: 3.9 mmol/L (ref 3.5–5.1)
Sodium: 138 mmol/L (ref 135–145)
Total Bilirubin: 0.5 mg/dL (ref 0.3–1.2)
Total Protein: 6.6 g/dL (ref 6.5–8.1)

## 2021-09-20 LAB — LACTIC ACID, PLASMA: Lactic Acid, Venous: 1.3 mmol/L (ref 0.5–1.9)

## 2021-09-20 MED ORDER — SODIUM CHLORIDE 0.9 % IV SOLN
3.0000 g | Freq: Once | INTRAVENOUS | Status: AC
  Administered 2021-09-21: 3 g via INTRAVENOUS
  Filled 2021-09-20: qty 8

## 2021-09-20 MED ORDER — IOHEXOL 300 MG/ML  SOLN
75.0000 mL | Freq: Once | INTRAMUSCULAR | Status: AC | PRN
  Administered 2021-09-20: 75 mL via INTRAVENOUS
  Filled 2021-09-20: qty 75

## 2021-09-20 MED ORDER — CLINDAMYCIN PHOSPHATE 600 MG/50ML IV SOLN
600.0000 mg | Freq: Once | INTRAVENOUS | Status: AC
  Administered 2021-09-20: 600 mg via INTRAVENOUS
  Filled 2021-09-20: qty 50

## 2021-09-20 NOTE — ED Provider Notes (Signed)
[facility] Provider Note  Patient Contact: 10:13 PM (approximate)   History   Abscess   HPI  Vincent Peterson is a 51 y.o. male who presents the emergency department complaining of swelling up underneath his chin.  He states that this area has been growing over the past couple of days.  Patient states that he is not having pain with swallowing but he has no tongue pain and states that he has had no swelling inside his mouth.  No fevers or chills, URI symptoms such as nasal congestion or cough.  Patient does not take any medications for same.  No history of recurrent skin infections.     Physical Exam   Triage Vital Signs: ED Triage Vitals  Enc Vitals Group     BP 09/20/21 2152 (!) 160/98     Pulse Rate 09/20/21 2152 89     Resp 09/20/21 2152 16     Temp 09/20/21 2152 98.9 F (37.2 C)     Temp Source 09/20/21 2152 Oral     SpO2 09/20/21 2152 97 %     Weight 09/20/21 2149 125 lb (56.7 kg)     Height 09/20/21 2149 5\' 5"  (1.651 m)     Head Circumference --      Peak Flow --      Pain Score 09/20/21 2148 8     Pain Loc --      Pain Edu? --      Excl. in GC? --     Most recent vital signs: Vitals:   09/20/21 2152  BP: (!) 160/98  Pulse: 89  Resp: 16  Temp: 98.9 F (37.2 C)  SpO2: 97%     General: Alert and in no acute distress.  ENT:      Ears:       Nose: No congestion/rhinnorhea.      Mouth/Throat: Mucous membranes are moist.  Visualization of the oropharynx reveals no erythema or edema.  Visualization of the tongue reveals no edema underneath the tongue and there is no tenderness in the floor of the mouth.  Visualization underneath the chin in the submandibular space reveals edema and erythema with tenderness along the soft tissues between both mandibles.  This does cross the midline.  There is no crepitus.  In this area it does appear that patient had shaved recently and there is short hairs growing out of this area.  No soft tissue  injury such as laceration or abrasion identified. Neck: No stridor. No cervical spine tenderness to palpation.  Patient has submandibular swelling as documented above but there is no edema of the anterior neck. Hematological/Lymphatic/Immunilogical: No cervical lymphadenopathy. Cardiovascular:  Good peripheral perfusion Respiratory: Normal respiratory effort without tachypnea or retractions. Lungs CTAB. Good air entry to the bases with no decreased or absent breath sounds. Gastrointestinal: Bowel sounds 4 quadrants. Soft and nontender to palpation. No guarding or rigidity. No palpable masses. No distention. No CVA tenderness. Musculoskeletal: Full range of motion to all extremities.  Neurologic:  No gross focal neurologic deficits are appreciated.  Skin:   No rash noted Other:   ED Results / Procedures / Treatments   Labs (all labs ordered are listed, but only abnormal results are displayed) Labs Reviewed  COMPREHENSIVE METABOLIC PANEL  CBC WITH DIFFERENTIAL/PLATELET  LACTIC ACID, PLASMA  LACTIC ACID, PLASMA     EKG     RADIOLOGY    No results found.  PROCEDURES:  Critical Care performed: No  Procedures  MEDICATIONS ORDERED IN ED: Medications  clindamycin (CLEOCIN) IVPB 600 mg (has no administration in time range)     IMPRESSION / MDM / ASSESSMENT AND PLAN / ED COURSE  I reviewed the triage vital signs and the nursing notes.                              Differential diagnosis includes, but is not limited to, cellulitis, sialoadenitis, Lemierre's, Ludwig's angina    Patient presented to the emergency department with swelling, pain underneath the mandible.  This does cross the midline it is very tender to palpation.  On exam patient has no sublingual or swelling and there is no tenderness in the sublingual region to palpation.  On the external palpation it is tender but there is no crepitus concerning for superficial subcutaneous gas.  Given the area it does  appear that patient had shaved recently and I am highly suspicious that this is likely an ingrown hair causing cellulitis however given the location and the fact that it crosses midline with the patient reporting pain with swallowing I felt that the patient did warrant labs and imaging.  Patient is currently awaiting these results.  Prior to the return of these results patient care will be transferred to attending provider, Dr. Peter Minium for final diagnosis and disposition.   FINAL CLINICAL IMPRESSION(S) / ED DIAGNOSES   Final diagnoses:  None     Rx / DC Orders   ED Discharge Orders     None        Note:  This document was prepared using Dragon voice recognition software and may include unintentional dictation errors.   Lanette Hampshire 09/20/21 2245    Jene Every, MD 09/20/21 Mila Palmer    Jene Every, MD 09/20/21 [id number]

## 2021-09-20 NOTE — ED Triage Notes (Signed)
Pt to ED from home c/o abscess to throat for a couple days.  States difficulty swallowing d/t pain.  Pt speaking in complete and coherent sentences, chest rise even and unlabored, maintaining secretions and in NAD at this time.

## 2021-09-21 DIAGNOSIS — I10 Essential (primary) hypertension: Secondary | ICD-10-CM

## 2021-09-21 DIAGNOSIS — K122 Cellulitis and abscess of mouth: Principal | ICD-10-CM

## 2021-09-21 DIAGNOSIS — U071 COVID-19: Secondary | ICD-10-CM

## 2021-09-21 LAB — RESP PANEL BY RT-PCR (FLU A&B, COVID) ARPGX2
Influenza A by PCR: NEGATIVE
Influenza B by PCR: NEGATIVE
SARS Coronavirus 2 by RT PCR: POSITIVE — AB

## 2021-09-21 LAB — HIV ANTIBODY (ROUTINE TESTING W REFLEX): HIV Screen 4th Generation wRfx: NONREACTIVE

## 2021-09-21 MED ORDER — SODIUM CHLORIDE 0.9 % IV SOLN
3.0000 g | Freq: Four times a day (QID) | INTRAVENOUS | Status: DC
  Administered 2021-09-21 – 2021-09-22 (×6): 3 g via INTRAVENOUS
  Filled 2021-09-21 (×9): qty 8

## 2021-09-21 MED ORDER — CLINDAMYCIN PHOSPHATE 600 MG/50ML IV SOLN: 600.0000 mg | Freq: Three times a day (TID) | INTRAVENOUS | Status: DC

## 2021-09-21 MED ORDER — ACETAMINOPHEN 650 MG RE SUPP: 650.0000 mg | Freq: Four times a day (QID) | RECTAL | Status: DC | PRN

## 2021-09-21 MED ORDER — DOXYCYCLINE HYCLATE 100 MG PO TABS
100.0000 mg | ORAL_TABLET | Freq: Two times a day (BID) | ORAL | Status: DC
  Administered 2021-09-21 – 2021-09-22 (×3): 100 mg via ORAL
  Filled 2021-09-21 (×3): qty 1

## 2021-09-21 MED ORDER — DEXAMETHASONE SODIUM PHOSPHATE 10 MG/ML IJ SOLN
10.0000 mg | Freq: Two times a day (BID) | INTRAMUSCULAR | Status: DC
  Administered 2021-09-21: 10 mg via INTRAVENOUS
  Filled 2021-09-21 (×2): qty 1

## 2021-09-21 MED ORDER — NICOTINE 21 MG/24HR TD PT24
21.0000 mg | MEDICATED_PATCH | Freq: Every day | TRANSDERMAL | Status: DC
  Administered 2021-09-21 – 2021-09-22 (×2): 21 mg via TRANSDERMAL
  Filled 2021-09-21 (×2): qty 1

## 2021-09-21 MED ORDER — DEXAMETHASONE SODIUM PHOSPHATE 4 MG/ML IJ SOLN
10.0000 mg | Freq: Two times a day (BID) | INTRAMUSCULAR | Status: DC
  Administered 2021-09-21 – 2021-09-22 (×2): 10 mg via INTRAVENOUS
  Filled 2021-09-21 (×3): qty 2.5

## 2021-09-21 MED ORDER — AMLODIPINE BESYLATE 5 MG PO TABS
5.0000 mg | ORAL_TABLET | Freq: Every day | ORAL | Status: DC
  Administered 2021-09-21: 5 mg via ORAL
  Filled 2021-09-21 (×2): qty 1

## 2021-09-21 MED ORDER — HYDROCODONE-ACETAMINOPHEN 5-325 MG PO TABS
1.0000 | ORAL_TABLET | ORAL | Status: DC | PRN
  Administered 2021-09-21 (×3): 1 via ORAL
  Administered 2021-09-21: 2 via ORAL
  Administered 2021-09-22: 1 via ORAL
  Filled 2021-09-21 (×4): qty 1
  Filled 2021-09-21: qty 2

## 2021-09-21 MED ORDER — ACETAMINOPHEN 325 MG PO TABS: 650.0000 mg | ORAL_TABLET | Freq: Four times a day (QID) | ORAL | Status: DC | PRN

## 2021-09-21 MED ORDER — ONDANSETRON HCL 4 MG/2ML IJ SOLN: 4.0000 mg | Freq: Four times a day (QID) | INTRAMUSCULAR | Status: DC | PRN

## 2021-09-21 MED ORDER — ONDANSETRON HCL 4 MG PO TABS: 4.0000 mg | ORAL_TABLET | Freq: Four times a day (QID) | ORAL | Status: DC | PRN

## 2021-09-21 NOTE — Assessment & Plan Note (Addendum)
Prescribed low-dose Norvasc.

## 2021-09-21 NOTE — Progress Notes (Signed)
°  Progress Note   Patient: Vincent Peterson [id number] DOB: May 12, 1971 DOA: 09/20/2021     0 DOS: the patient was seen and examined on 09/21/2021    Assessment and Plan: * Submandibular abscess- (present on admission) The patient was started on Unasyn and given 1 dose of clindamycin in the emergency room.  I will continue the Unasyn and add doxycycline.  Nocturnist consulted ENT await for ENT consult.  Add Decadron.  COVID-19 virus infection The patient states he is asymptomatic and declined treatment for COVID-19 infection.  Essential hypertension- (present on admission) Started on Amlodipine 5 mg daily   Subjective: Patient still having pain underneath his jaw.  Found to have a submandibular abscess.  Patient states that he is able to eat.  Physical Exam: Vitals:   09/21/21 0700 09/21/21 0730 09/21/21 0800 09/21/21 1025  BP: 134/71 129/74 140/80 135/82  Pulse: 74 72 76 69  Resp: 16  18 16   Temp:    98.4 F (36.9 C)  TempSrc:      SpO2: 98% 96% 99% 99%  Weight:      Height:       Physical Exam HENT:     Head: Normocephalic.     Mouth/Throat:     Pharynx: No oropharyngeal exudate.  Eyes:     General: Lids are normal.     Conjunctiva/sclera: Conjunctivae normal.  Neck:     Comments: Tenderness left neck underneath the jawline. Pulmonary:     Breath sounds: Normal breath sounds. No decreased breath sounds, wheezing, rhonchi or rales.  Abdominal:     Palpations: Abdomen is soft.     Tenderness: There is no abdominal tenderness.  Musculoskeletal:     Right lower leg: No swelling.     Left lower leg: No swelling.  Skin:    General: Skin is warm.     Findings: No rash.  Neurological:     Mental Status: He is alert and oriented to person, place, and time.     Data Reviewed: Laboratory and radiological data reviewed  Family Communication: Family at bedside  Disposition: Status is: Inpatient Remains inpatient appropriate because: Patient being treated for  submandibular abscess.  Planned Discharge Destination: Home  Author: Loletha Grayer, MD 09/21/2021 12:22 PM  For on call review [email or web address].

## 2021-09-21 NOTE — ED Notes (Signed)
Lab at bedside

## 2021-09-21 NOTE — ED Notes (Signed)
Pt given ginger ale and sprite. 

## 2021-09-21 NOTE — Assessment & Plan Note (Addendum)
The patient is asymptomatic with regards to his COVID infection.  He did not want any treatment.  Since he does work in [employer] I recommended out of work for a total of 10 days.

## 2021-09-21 NOTE — Consult Note (Signed)
..  Vincent Peterson, Vincent Peterson [id number] 04-28-1971 Alford Highland, MD  Reason for Consult: cellulitis, abscess  HPI: 51 y.o. male admitted overnight for submandibular abscess.  Patient reports pain developing over several days and to the point he had difficulty eating yesterday. CT scan was obtained that showed possible submandibular abscess.  Patient reports still has pain but significantly improved overnight and tolerating diet today.  Patient is positive for COVID of which patient reports no current symptoms.  Allergies: No Known Allergies  ROS: Review of systems normal other than 12 systems except per HPI.  PMH:  Past Medical History:  Diagnosis Date   Hypertension     FH: History reviewed. No pertinent family history.  SH:  Social History   Socioeconomic History   Marital status: Single    Spouse name: Not on file   Number of children: Not on file   Years of education: Not on file   Highest education level: Not on file  Occupational History   Not on file  Tobacco Use   Smoking status: Every Day    Packs/day: 0.50    Years: 25.00    Pack years: 12.50    Types: Cigarettes   Smokeless tobacco: Never  Vaping Use   Vaping Use: Never used  Substance and Sexual Activity   Alcohol use: Yes    Comment: socially, weekends   Drug use: Yes    Types: Cocaine, Marijuana   Sexual activity: Yes    Partners: Female    Birth control/protection: Condom  Other Topics Concern   Not on file  Social History Narrative   Not on file   Social Determinants of Health   Financial Resource Strain: Not on file  Food Insecurity: Not on file  Transportation Needs: Not on file  Physical Activity: Not on file  Stress: Not on file  Social Connections: Not on file  Intimate Partner Violence: Not At Risk   Fear of Current or Ex-Partner: No   Emotionally Abused: No   Physically Abused: No   Sexually Abused: No    PSH: History reviewed. No pertinent surgical history.  Physical  Exam:  GEN-  supine in bed with family NEURO- CN 2-12 grossly intact and symmetric. EARS- external ears WNL OC/OP-  no abnormal masses or lesions, floor of mouth WNL and soft to palpation, normal flow of fluid from submandibular duct NECK- submental midline to slightly left side fullness and induration and swelling with tenderness.  No fluctuance palpated. RESP- unlabored CARD-  RRR  A/P: Submental abscess with celluitis  Plan:  Discussed findings with patient and family.  Unclear of source as does not appear to be from submandibular gland and appears to possibly be from cellulitis that has spread.  No significant inflammation of skin, however.  Significant improvement overnight per patient.  No floor of mouth issues and no xerostomia.  Unclear relationship to COVID positivity.  Discussed options and given improvement, small nature of fluid collection, and no flucutance on exam, recommend observation at this time.  Will re-evaluate in the morning with possible I&D if flucutance appears.  Recommend continued Unasyn and steroids.   Bud Face 09/21/2021 3:27 PM

## 2021-09-21 NOTE — Progress Notes (Signed)
Pharmacy Antibiotic Note  Vincent Peterson is a 51 y.o. male admitted on 09/20/2021 with sub-mandibular infection.  Pharmacy has been consulted for Unasyn dosing.  Plan: Unasyn 3 gm IV X 1 given in ED on 2/16 @ 0005. Unasyn 3 gm IV Q6H ordered to continue on 2/16 @ 0600.   Height: 5\' 5"  (165.1 cm) Weight: 56.7 kg (125 lb) IBW/kg (Calculated) : 61.5  Temp (24hrs), Avg:98.9 F (37.2 C), Min:98.9 F (37.2 C), Max:98.9 F (37.2 C)  Recent Labs  Lab 09/20/21 2239  WBC 12.4*  CREATININE 1.07  LATICACIDVEN 1.3    Estimated Creatinine Clearance: 67 mL/min (by C-G formula based on SCr of 1.07 mg/dL).    No Known Allergies  Antimicrobials this admission:   >>    >>   Dose adjustments this admission:   Microbiology results:  BCx:   UCx:    Sputum:    MRSA PCR:   Thank you for allowing pharmacy to be a part of this patients care.  Iverna Hammac D 09/21/2021 12:42 AM

## 2021-09-21 NOTE — ED Notes (Signed)
Pt approaches the front desk in the ED asking to be let back to his room.  Patient found to be an admission patient that stepped outside off property.  RN explained to the patient that he is unable to leave the room or next time he will be taken out of the system as AMA.  Pt and family verbalized understanding of this information.  Pt escorted back to patient room and primary RN made aware.

## 2021-09-21 NOTE — Assessment & Plan Note (Addendum)
Patient was started on IV Unasyn and given 1 dose of clindamycin.  I added doxycycline and Decadron.  Upon discharge I will switch over to Augmentin and doxycycline and prednisone.  Follow-up with ENT as outpatient.  Patient was seen by Dr. Andee Poles in the hospital.

## 2021-09-21 NOTE — H&P (Signed)
History and Physical    Patient: Vincent Peterson JHE:[id number] DOB: 04-06-1971 DOA: 09/20/2021 DOS: the patient was seen and examined on 09/21/2021 PCP: Patient, No Pcp Per (Inactive)  Patient coming from: Home  Chief Complaint:  Chief Complaint  Patient presents with   Abscess    HPI: Vincent Peterson is a 51 y.o. male with medical history significant of Hypertension who presents with a several day history of pain and swelling under his chin that has been progressing.  He denies fever or chills.  He denies difficulty swallowing, speaking and denies drooling  ED course: On arrival afebrile, BP 160/98 with otherwise normal vitals Blood work with leukocytosis of 12,400 and lactic acid 1.3 but otherwise unremarkable CT soft tissue neck shows left submandibular space abscess measuring 13 x 11 mm with moderate surrounding inflammatory stranding   Patient started on Unasyn and clindamycin IV.  Hospitalist consulted for admission.   Review of Systems: As mentioned in the history of present illness. All other systems reviewed and are negative. Past Medical History:  Diagnosis Date   Hypertension    History reviewed. No pertinent surgical history. Social History:  reports that he has been smoking cigarettes. He has a 12.50 pack-year smoking history. He has never used smokeless tobacco. He reports current alcohol use. He reports current drug use. Drugs: Cocaine and Marijuana.  No Known Allergies  History reviewed. No pertinent family history.  Prior to Admission medications   Not on File    Physical Exam: Vitals:   09/20/21 2149 09/20/21 2152  BP:  (!) 160/98  Pulse:  89  Resp:  16  Temp:  98.9 F (37.2 C)  TempSrc:  Oral  SpO2:  97%  Weight: 56.7 kg   Height: 5\' 5"  (1.651 m)    Physical Exam Vitals and nursing note reviewed.  Constitutional:      General: He is not in acute distress.    Appearance: Normal appearance.  HENT:     Head: Normocephalic and atraumatic.      Jaw: Swelling present.     Comments: Swelling and tenderness in mid and left submandibular region Cardiovascular:     Rate and Rhythm: Normal rate and regular rhythm.     Pulses: Normal pulses.     Heart sounds: Normal heart sounds. No murmur heard. Pulmonary:     Effort: Pulmonary effort is normal.     Breath sounds: Normal breath sounds. No wheezing or rhonchi.  Abdominal:     General: Bowel sounds are normal.     Palpations: Abdomen is soft.     Tenderness: There is no abdominal tenderness.  Musculoskeletal:        General: No swelling or tenderness. Normal range of motion.     Cervical back: Normal range of motion and neck supple.  Skin:    General: Skin is warm and dry.  Neurological:     General: No focal deficit present.     Mental Status: He is alert. Mental status is at baseline.  Psychiatric:        Mood and Affect: Mood normal.        Behavior: Behavior normal.    Data Reviewed: Notes from primary care and specialist visits, past discharge summaries. Prior diagnostic testing as applicable to current admission diagnoses Updated medications and problem lists for reconciliation ED course, including vitals, labs, imaging, treatment and response to treatment Triage notes and ED providers notes   Assessment and Plan: * Submandibular abscess- (present on admission)  Continue IV clindamycin Pain control Soft diet ENT consult  Essential hypertension- (present on admission) Uncontrolled. Amlodipine 5 mg daily       Advance Care Planning:   Code Status: Prior   Consults: ENT  Family Communication:   Severity of Illness: The appropriate patient status for this patient is OBSERVATION. Observation status is judged to be reasonable and necessary in order to provide the required intensity of service to ensure the patient's safety. The patient's presenting symptoms, physical exam findings, and initial radiographic and laboratory data in the context of their medical  condition is felt to place them at decreased risk for further clinical deterioration. Furthermore, it is anticipated that the patient will be medically stable for discharge from the hospital within 2 midnights of admission.   Author: Andris Baumann, MD 09/21/2021 12:31 AM  For on call review [email or web address].

## 2021-09-22 ENCOUNTER — Other Ambulatory Visit: Payer: Self-pay

## 2021-09-22 MED ORDER — AMLODIPINE BESYLATE 2.5 MG PO TABS
2.5000 mg | ORAL_TABLET | Freq: Every day | ORAL | 0 refills | Status: AC
  Filled 2021-09-22: qty 30, 30d supply, fill #0

## 2021-09-22 MED ORDER — HYDROCODONE-ACETAMINOPHEN 5-325 MG PO TABS: 1.0000 | ORAL_TABLET | Freq: Four times a day (QID) | ORAL | 0 refills | Status: AC | PRN

## 2021-09-22 MED ORDER — DOXYCYCLINE HYCLATE 100 MG PO TABS
100.0000 mg | ORAL_TABLET | Freq: Two times a day (BID) | ORAL | 0 refills | Status: AC
  Filled 2021-09-22: qty 26, 13d supply, fill #0

## 2021-09-22 MED ORDER — AMLODIPINE BESYLATE 5 MG PO TABS
2.5000 mg | ORAL_TABLET | Freq: Every day | ORAL | Status: DC
  Administered 2021-09-22: 2.5 mg via ORAL
  Filled 2021-09-22: qty 1

## 2021-09-22 MED ORDER — NICOTINE 21 MG/24HR TD PT24
21.0000 mg | MEDICATED_PATCH | Freq: Every day | TRANSDERMAL | 0 refills | Status: AC
  Filled 2021-09-22: qty 28, 28d supply, fill #0

## 2021-09-22 MED ORDER — PREDNISONE 10 MG PO TABS
ORAL_TABLET | ORAL | 0 refills | Status: DC
  Filled 2021-09-22: qty 21, 10d supply, fill #0

## 2021-09-22 MED ORDER — AMOXICILLIN-POT CLAVULANATE 875-125 MG PO TABS
1.0000 | ORAL_TABLET | Freq: Two times a day (BID) | ORAL | 0 refills | Status: AC
  Filled 2021-09-22: qty 26, 13d supply, fill #0

## 2021-09-22 NOTE — Progress Notes (Signed)
DISCHARGE NOTE: ° °Pt given discharge instructions and verbalized understanding.  °

## 2021-09-22 NOTE — TOC Progression Note (Signed)
Transition of Care [facility]) - Progression Note    Patient Details  Name: Vincent Peterson MRN: [id number] Date of Birth: March 07, 1971  [facility] of [facility]) CM/SW Contact  Marlowe Sax, RN Phone Number: 09/22/2021, 9:29 AM  Clinical Narrative:   Provided [facility] and sent referral via email, He will get meds at [facility]          Expected Discharge Plan and Services           Expected Discharge Date: 09/22/21                                     Social Determinants of Health (SDOH) Interventions    Readmission Risk Interventions No flowsheet data found.

## 2021-09-22 NOTE — Discharge Summary (Signed)
Physician Discharge Summary   Patient: Vincent Peterson MRN: [id number] DOB: 03-03-1971  Admit date:     09/20/2021  Discharge date: 09/22/21  Discharge Physician: Loletha Grayer   PCP: Patient, No Pcp Per (Inactive)   Recommendations at discharge:   [facility] Follow-up Dr. Pryor Ochoa next week.  Discharge Diagnoses: Principal Problem:   Submandibular abscess Active Problems:   COVID-19 virus infection   Essential hypertension   Hospital Course: -He came into the emergency room on 07/20/2021.  He was admitted with submandibular abscess and was started on IV Unasyn and given a dose of clindamycin.  I added doxycycline and Decadron.  The patient was seen by ENT and recommended continued medical management and follow-up as outpatient.  It is believed to be a skin source of this infection.  The patient was found to be COVID-positive.  The patient was asymptomatic with regards to this.  Medications were prescribed into medication management except for the pain medication which went into [facility].  Assessment and Plan: * Submandibular abscess- (present on admission) Patient was started on IV Unasyn and given 1 dose of clindamycin.  I added doxycycline and Decadron.  Upon discharge I will switch over to Augmentin and doxycycline and prednisone.  Follow-up with ENT as outpatient.  Patient was seen by Dr. Pryor Ochoa in the hospital.  COVID-19 virus infection The patient is asymptomatic with regards to his COVID infection.  He did not want any treatment.  Since he does work in [employer] I recommended out of work for a total of 10 days.  Essential hypertension- (present on admission) Prescribed low-dose Norvasc.           Consultants: ENT Procedures performed: None Disposition: Home Diet recommendation:  Cardiac diet  DISCHARGE MEDICATION: Allergies as of 09/22/2021   No Known Allergies      Medication List     TAKE these medications     amLODipine 2.5 MG tablet Commonly known as: NORVASC Take 1 tablet (2.5 mg total) by mouth once daily.   amoxicillin-clavulanate 875-125 MG tablet Commonly known as: Augmentin Take 1 tablet by mouth 2 (two) times daily for 13 days.   doxycycline 100 MG tablet Commonly known as: VIBRA-TABS Take 1 tablet (100 mg total) by mouth every 12 (twelve) hours for 13 days.   HYDROcodone-acetaminophen 5-325 MG tablet Commonly known as: NORCO/VICODIN Take 1 tablet by mouth every 6 (six) hours as needed for severe pain.   nicotine 21 mg/24hr patch Commonly known as: NICODERM CQ - dosed in mg/24 hours Place 1 patch (21 mg total) onto the skin daily. (Okay to substitute generic)   predniSONE 10 MG tablet Commonly known as: DELTASONE Take 4 tablets by mouth on days 1 and 2, then take 3 tablets on days 3 and 4, then take 2 tablets on days 5 and 6, then take 1 tablet on days 7 and 8, then take 1/2 tablet on days 9 and 10. (4 tabs po day1, 2; 3 tabs po day3,4; 2 tabs po day5,6; 1 tab po day7,8; 1/2 tab po day9,10)        Follow-up Information     [facility]. Schedule an appointment as soon as possible for a visit in 2 week(s).   Specialty: Primary Care Why: pt will need to call and set up appointment Contact information: [address] [place]        Carloyn Manner, MD. Go on  09/25/2021.   Specialty: Otolaryngology Why: At 9:15 Bring photo ID, medication list and insurance card to appointment Contact information: [address] [place] [address] [place] [address] [phone]                 Discharge Exam: Ceasar Mons Weights   09/20/21 2149  Weight: 56.7 kg   Physical Exam HENT:     Head: Normocephalic.     Mouth/Throat:     Pharynx: No oropharyngeal exudate.  Eyes:     General: Lids are normal.     Conjunctiva/sclera: Conjunctivae normal.  Neck:     Comments: Fullness and tenderness left  neck underneath the jawline. Pulmonary:     Breath sounds: Normal breath sounds. No decreased breath sounds, wheezing, rhonchi or rales.  Abdominal:     Palpations: Abdomen is soft.     Tenderness: There is no abdominal tenderness.  Musculoskeletal:     Right lower leg: No swelling.     Left lower leg: No swelling.  Skin:    General: Skin is warm.     Findings: No rash.  Neurological:     Mental Status: He is alert and oriented to person, place, and time.     Condition at discharge: stable  The results of significant diagnostics from this hospitalization (including imaging, microbiology, ancillary and laboratory) are listed below for reference.   Imaging Studies: CT Soft Tissue Neck W Contrast  Result Date: 09/20/2021 CLINICAL DATA:  Submandibular infection EXAM: CT NECK WITH CONTRAST TECHNIQUE: Multidetector CT imaging of the neck was performed using the standard protocol following the bolus administration of intravenous contrast. RADIATION DOSE REDUCTION: This exam was performed according to the departmental dose-optimization program which includes automated exposure control, adjustment of the mA and/or kV according to patient size and/or use of iterative reconstruction technique. CONTRAST:  59mL OMNIPAQUE IOHEXOL 300 MG/ML  SOLN COMPARISON:  None. FINDINGS: PHARYNX AND LARYNX: The nasopharynx, oropharynx and larynx are normal. Visible portions of the oral cavity, tongue base and floor of mouth are normal. Normal epiglottis, vallecula and pyriform sinuses. The larynx is normal. No retropharyngeal abscess, effusion or lymphadenopathy. SALIVARY GLANDS: Normal parotid, submandibular and sublingual glands. There is a left submandibular space abscess that measures 13 x 11 mm. There is moderate surrounding inflammatory stranding. THYROID: Normal. LYMPH NODES: Subcentimeter submandibular lymph nodes. No enlarged or abnormal density nodes. VASCULAR: Major cervical vessels are patent. LIMITED  INTRACRANIAL: Normal. VISUALIZED ORBITS: Normal. MASTOIDS AND VISUALIZED PARANASAL SINUSES: No fluid levels or advanced mucosal thickening. No mastoid effusion. SKELETON: No bony spinal canal stenosis. No lytic or blastic lesions. UPPER CHEST: Clear. OTHER: None. IMPRESSION: Left submandibular space abscess measuring 13 x 11 mm with moderate surrounding inflammatory stranding. Electronically Signed   By: Deatra Robinson M.D.   On: 09/20/2021 23:38    Microbiology: Results for orders placed or performed during the hospital encounter of 09/20/21  Resp Panel by RT-PCR (Flu A&B, Covid) Nasopharyngeal Swab     Status: Abnormal   Collection Time: 09/21/21 12:07 AM   Specimen: Nasopharyngeal Swab; Nasopharyngeal(NP) swabs in vial transport medium  Result Value Ref Range Status   SARS Coronavirus 2 by RT PCR POSITIVE (A) NEGATIVE Final    Comment: (NOTE) SARS-CoV-2 target nucleic acids are DETECTED.  The SARS-CoV-2 RNA is generally detectable in upper respiratory specimens during the acute phase of infection. Positive results are indicative of the presence of the identified virus, but do not rule out bacterial infection or co-infection with other pathogens not  detected by the test. Clinical correlation with patient history and other diagnostic information is necessary to determine patient infection status. The expected result is Negative.  Fact Sheet for Patients: EntrepreneurPulse.com.au  Fact Sheet for Healthcare Providers: IncredibleEmployment.be  This test is not yet approved or cleared by the [place] FDA and  has been authorized for detection and/or diagnosis of SARS-CoV-2 by FDA under an Emergency Use Authorization (EUA).  This EUA will remain in effect (meaning this test can be used) for the duration of  the COVID-19 declaration under Section 564(b)(1) of the A ct, 21 U.S.C. section 360bbb-3(b)(1), unless the authorization is terminated or revoked  sooner.     Influenza A by PCR NEGATIVE NEGATIVE Final   Influenza B by PCR NEGATIVE NEGATIVE Final    Comment: (NOTE) The Xpert Xpress SARS-CoV-2/FLU/RSV plus assay is intended as an aid in the diagnosis of influenza from Nasopharyngeal swab specimens and should not be used as a sole basis for treatment. Nasal washings and aspirates are unacceptable for Xpert Xpress SARS-CoV-2/FLU/RSV testing.  Fact Sheet for Patients: EntrepreneurPulse.com.au  Fact Sheet for Healthcare Providers: IncredibleEmployment.be  This test is not yet approved or cleared by the [place] FDA and has been authorized for detection and/or diagnosis of SARS-CoV-2 by FDA under an Emergency Use Authorization (EUA). This EUA will remain in effect (meaning this test can be used) for the duration of the COVID-19 declaration under Section 564(b)(1) of the Act, 21 U.S.C. section 360bbb-3(b)(1), unless the authorization is terminated or revoked.  Performed at [facility], [address] [place], [place] [address]     Labs: CBC: Recent Labs  Lab 09/20/21 2239  WBC 12.4*  NEUTROABS 7.9*  HGB 14.6  HCT 42.3  MCV 94.0  PLT [id number]   Basic Metabolic Panel: Recent Labs  Lab 09/20/21 2239  NA 138  K 3.9  CL 104  CO2 27  GLUCOSE 105*  BUN 13  CREATININE 1.07  CALCIUM 8.9   Liver Function Tests: Recent Labs  Lab 09/20/21 2239  AST 19  ALT 12  ALKPHOS 36*  BILITOT 0.5  PROT 6.6  ALBUMIN 4.0     Discharge time spent: greater than 30 minutes.  Signed: Loletha Grayer, MD [facility] 09/22/2021

## 2021-09-22 NOTE — Progress Notes (Signed)
..  09/22/2021 8:17 AM  [name] [phone]  Hospital Day 2    Temp:  [98 F (36.7 C)-98.4 F (36.9 C)] 98 F (36.7 C) (02/17 0449) Pulse Rate:  [66-80] 71 (02/17 0449) Resp:  [16] 16 (02/17 0449) BP: (103-135)/(64-82) 123/69 (02/17 0449) SpO2:  [97 %-99 %] 99 % (02/17 0449),     Intake/Output Summary (Last 24 hours) at 09/22/2021 0817 Last data filed at 09/21/2021 1639 Gross per 24 hour  Intake 100 ml  Output --  Net 100 ml    No results found for this or any previous visit (from the past 24 hour(s)).  SUBJECTIVE:  No acute events  OBJECTIVE:  GEN- sleeping in bed, NAD OC/OP- WNL NECK-  improved edema and induration, continued fullness submental region  IMPRESSION:  submental abscess, most likely skin source  PLAN:  OK to discharge home per ENT.  Follow up next week.  COVID precuations.  Vincent Peterson 09/22/2021, 8:17 AM

## 2021-09-28 ENCOUNTER — Other Ambulatory Visit: Payer: Self-pay

## 2021-10-22 ENCOUNTER — Encounter: Payer: Self-pay | Admitting: Emergency Medicine

## 2021-10-22 ENCOUNTER — Other Ambulatory Visit: Payer: Self-pay

## 2021-10-22 ENCOUNTER — Emergency Department: Payer: Self-pay

## 2021-10-22 ENCOUNTER — Emergency Department
Admission: EM | Admit: 2021-10-22 | Discharge: 2021-10-22 | Disposition: A | Discharge status: Home / Self Care | Payer: Self-pay | Attending: Emergency Medicine | Admitting: Emergency Medicine

## 2021-10-22 DIAGNOSIS — R0789 Other chest pain: Secondary | ICD-10-CM | POA: Insufficient documentation

## 2021-10-22 DIAGNOSIS — I10 Essential (primary) hypertension: Secondary | ICD-10-CM | POA: Insufficient documentation

## 2021-10-22 DIAGNOSIS — M542 Cervicalgia: Secondary | ICD-10-CM | POA: Insufficient documentation

## 2021-10-22 LAB — BASIC METABOLIC PANEL
Anion gap: 10 (ref 5–15)
BUN: 12 mg/dL (ref 6–20)
CO2: 28 mmol/L (ref 22–32)
Calcium: 8.8 mg/dL — ABNORMAL LOW (ref 8.9–10.3)
Chloride: 102 mmol/L (ref 98–111)
Creatinine, Ser: 1 mg/dL (ref 0.61–1.24)
GFR, Estimated: >60 mL/min (ref >60)
Glucose, Bld: 146 mg/dL — ABNORMAL HIGH (ref 70–99)
Potassium: 3.9 mmol/L (ref 3.5–5.1)
Sodium: 140 mmol/L (ref 135–145)

## 2021-10-22 LAB — CBC
HCT: 41 % (ref 39.0–52.0)
Hemoglobin: 13.8 g/dL (ref 13.0–17.0)
MCH: 31.9 pg (ref 26.0–34.0)
MCHC: 33.7 g/dL (ref 30.0–36.0)
MCV: 94.7 fL (ref 80.0–100.0)
Platelets: 323 10*3/uL (ref 150–400)
RBC: 4.33 MIL/uL (ref 4.22–5.81)
RDW: 12.2 % (ref 11.5–15.5)
WBC: 11.5 10*3/uL — ABNORMAL HIGH (ref 4.0–10.5)
nRBC: 0 % (ref 0.0–0.2)

## 2021-10-22 LAB — TROPONIN I (HIGH SENSITIVITY): Troponin I (High Sensitivity): 6 ng/L (ref <18)

## 2021-10-22 NOTE — ED Provider Notes (Signed)
? ?[facility] ?Provider Note ? ? Event Date/Time  ? First MD Initiated Contact with Patient 10/22/21 1217   ?  (approximate) ?History  ?Chest Pain ? ?HPI ?Vincent Peterson is a 51 y.o. male with a stated past medical history of high blood pressure presents for bilateral upper chest wall pain that began approximately 24 hours prior to arrival.  Patient describes his pain as 8/10, nonradiating, and worsened with moving his arms above his head.  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  Patient denies any shortness of breath, dyspnea on exertion, difficulty breathing/swallowing, or weakness/numbness/paresthesias in any extremity ?Physical Exam  ?Triage Vital Signs: ?ED Triage Vitals  ?Enc Vitals Group  ?   BP 10/22/21 1154 (!) 157/92  ?   Pulse Rate 10/22/21 1154 91  ?   Resp 10/22/21 1154 19  ?   Temp 10/22/21 1154 97.9 ?F (36.6 ?C)  ?   Temp Source 10/22/21 1154 Oral  ?   SpO2 10/22/21 1154 100 %  ?   Weight 10/22/21 1146 130 lb (59 kg)  ?   Height 10/22/21 1146 5\' 4"  (1.626 m)  ?   Head Circumference --   ?   Peak Flow --   ?   Pain Score 10/22/21 1146 8  ?   Pain Loc --   ?   Pain Edu? --   ?   Excl. in GC? --   ? ?Most recent vital signs: ?Vitals:  ? 10/22/21 1400 10/22/21 1500  ?BP: 128/79 122/74  ?Pulse: 81 84  ?Resp:  17  ?Temp:  98 ?F (36.7 ?C)  ?SpO2: 97% 96%  ? ?General: Awake, oriented x4. ?CV:  Good peripheral perfusion.  ?Resp:  Normal effort.  ?Abd:  No distention.  ?Other:  Middle-aged African-American male laying in bed in no distress.  Significant tenderness to palpation over bilateral upper chest wall as well as with raising patient's arms above his head ?ED Results / Procedures / Treatments  ?Labs ?(all labs ordered are listed, but only abnormal results are displayed) ?Labs Reviewed  ?BASIC METABOLIC PANEL - Abnormal; Notable for the following components:  ?    Result Value  ? Glucose, Bld 146 (*)   ? Calcium 8.8  (*)   ? All other components within normal limits  ?CBC - Abnormal; Notable for the following components:  ? WBC 11.5 (*)   ? All other components within normal limits  ?TROPONIN I (HIGH SENSITIVITY)  ?TROPONIN I (HIGH SENSITIVITY)  ? ?EKG ?ED ECG REPORT ?I, 10/24/21, the attending physician, personally viewed and interpreted this ECG. ?Date: 10/22/2021 ?EKG Time: 1146 ?Rate: 89 ?Rhythm: normal sinus rhythm ?QRS Axis: normal ?Intervals: normal ?ST/T Wave abnormalities: normal ?Narrative Interpretation: no evidence of acute ischemia ?RADIOLOGY ?ED MD interpretation: 2 view chest x-ray interpreted by me shows no evidence of acute abnormalities including no pneumonia, pneumothorax, or widened mediastinum ?-Agree with radiology assessment ?Official radiology report(s): ?DG Chest 2 View ? ?Result Date: 10/22/2021 ?CLINICAL DATA:  Intermittent left-sided chest pain for the past 2 days. EXAM: CHEST - 2 VIEW COMPARISON:  Chest x-ray dated October 17, 2017. FINDINGS: The heart size and mediastinal contours are within normal limits. Both lungs are clear. The visualized skeletal structures are unremarkable. IMPRESSION: No active cardiopulmonary disease. Electronically Signed   By: October 19, 2017 M.D.   On: 10/22/2021 12:16   ?PROCEDURES: ?Critical Care performed: No ?.1-3 Lead EKG Interpretation ?Performed by:  Merwyn Katos, MD ?Authorized by: Merwyn Katos, MD  ? ?  Interpretation: normal   ?  ECG rate:  83 ?  ECG rate assessment: normal   ?  Rhythm: sinus rhythm   ?  Ectopy: none   ?  Conduction: normal   ?MEDICATIONS ORDERED IN ED: ?Medications - No data to display ?IMPRESSION / MDM / ASSESSMENT AND PLAN / ED COURSE  ?I reviewed the triage vital signs and the nursing notes. ?             ?               ?The patient is on the cardiac monitor to evaluate for evidence of arrhythmia and/or significant heart rate changes. ?Workup: ECG, CXR, CBC, BMP, Troponin ?Findings: ?ECG: No overt evidence of STEMI. No evidence of  Brugada?s sign, delta wave, epsilon wave, significantly prolonged QTc, or malignant arrhythmia ?HS Troponin: Negative x1 ?Other Labs unremarkable for emergent problems. ?CXR: Without PTX, PNA, or widened mediastinum ?Last Stress Test: Never ?Last Heart Catheterization: Never ?HEART Score: 2 ? ?Given History, Exam, and Workup I have low suspicion for ACS, Pneumothorax, Pneumonia, Pulmonary Embolus, Tamponade, Aortic Dissection or other emergent problem as a cause for this presentation.  ? ?Reassesment: Prior to discharge patient?s pain was controlled and they were well appearing. ? ?Disposition:  Discharge. Strict return precautions discussed with patient with full understanding. Advised patient to follow up promptly with primary care provider ? ? ?  ?FINAL CLINICAL IMPRESSION(S) / ED DIAGNOSES  ? ?Final diagnoses:  ?Chest wall pain  ? ?Rx / DC Orders  ? ?ED Discharge Orders   ? ? None  ? ?  ? ?Note:  This document was prepared using Dragon voice recognition software and may include unintentional dictation errors. ?  ?Merwyn Katos, MD ?10/22/21 1531 ? ?

## 2021-10-22 NOTE — ED Triage Notes (Signed)
Pt via POV from home. Pt c/o L sided CP, non-radiating intermittently for the past 2 days. Denies any cough or fever. Denies NVD. Denies SOB. Denies any cardiac issues. Pt is A&OX4 and NAD ?

## 2021-11-08 ENCOUNTER — Other Ambulatory Visit: Payer: Self-pay

## 2022-02-16 ENCOUNTER — Ambulatory Visit: Payer: Medicaid Other

## 2022-03-27 ENCOUNTER — Emergency Department
Admission: EM | Admit: 2022-03-27 | Discharge: 2022-03-27 | Disposition: A | Discharge status: Home / Self Care | Payer: Medicaid Other | Attending: Emergency Medicine | Admitting: Emergency Medicine

## 2022-03-27 ENCOUNTER — Emergency Department: Payer: Medicaid Other

## 2022-03-27 ENCOUNTER — Other Ambulatory Visit: Payer: Self-pay

## 2022-03-27 ENCOUNTER — Encounter: Payer: Self-pay | Admitting: Emergency Medicine

## 2022-03-27 DIAGNOSIS — I10 Essential (primary) hypertension: Secondary | ICD-10-CM | POA: Insufficient documentation

## 2022-03-27 DIAGNOSIS — N451 Epididymitis: Secondary | ICD-10-CM | POA: Insufficient documentation

## 2022-03-27 LAB — CBC
HCT: 43.8 % (ref 39.0–52.0)
Hemoglobin: 14.8 g/dL (ref 13.0–17.0)
MCH: 31.3 pg (ref 26.0–34.0)
MCHC: 33.8 g/dL (ref 30.0–36.0)
MCV: 92.6 fL (ref 80.0–100.0)
Platelets: 331 10*3/uL (ref 150–400)
RBC: 4.73 MIL/uL (ref 4.22–5.81)
RDW: 11.9 % (ref 11.5–15.5)
WBC: 5.1 10*3/uL (ref 4.0–10.5)
nRBC: 0 % (ref 0.0–0.2)

## 2022-03-27 LAB — URINALYSIS, ROUTINE W REFLEX MICROSCOPIC
Bilirubin Urine: NEGATIVE
Glucose, UA: NEGATIVE mg/dL
Ketones, ur: NEGATIVE mg/dL
Leukocytes,Ua: NEGATIVE
Nitrite: NEGATIVE
Protein, ur: NEGATIVE mg/dL
Specific Gravity, Urine: 1.027 (ref 1.005–1.030)
pH: 5 (ref 5.0–8.0)

## 2022-03-27 LAB — BASIC METABOLIC PANEL
Anion gap: 8 (ref 5–15)
BUN: 12 mg/dL (ref 6–20)
CO2: 27 mmol/L (ref 22–32)
Calcium: 8.9 mg/dL (ref 8.9–10.3)
Chloride: 103 mmol/L (ref 98–111)
Creatinine, Ser: 0.99 mg/dL (ref 0.61–1.24)
GFR, Estimated: >60 mL/min (ref >60)
Glucose, Bld: 103 mg/dL — ABNORMAL HIGH (ref 70–99)
Potassium: 3.8 mmol/L (ref 3.5–5.1)
Sodium: 138 mmol/L (ref 135–145)

## 2022-03-27 LAB — CHLAMYDIA/NGC RT PCR (ARMC ONLY)
Chlamydia Tr: NOT DETECTED
N gonorrhoeae: NOT DETECTED

## 2022-03-27 MED ORDER — LIDOCAINE HCL (PF) 1 % IJ SOLN
5.0000 mL | Freq: Once | INTRAMUSCULAR | Status: AC
Start: 2022-03-27 — End: 2022-03-27
  Administered 2022-03-27: 5 mL
  Filled 2022-03-27: qty 5

## 2022-03-27 MED ORDER — LEVOFLOXACIN 500 MG PO TABS: 500.0000 mg | ORAL_TABLET | Freq: Every day | ORAL | 0 refills | Status: AC

## 2022-03-27 MED ORDER — CEFTRIAXONE SODIUM 1 G IJ SOLR
500.0000 mg | Freq: Once | INTRAMUSCULAR | Status: AC
  Administered 2022-03-27: 500 mg via INTRAMUSCULAR
  Filled 2022-03-27: qty 10

## 2022-03-27 NOTE — ED Notes (Signed)
See triage note  Presents with rash to groin area with some swelling  States noticed sx's several days

## 2022-03-27 NOTE — ED Provider Notes (Signed)
[facility] Provider Note    Event Date/Time   First MD Initiated Contact with Patient 03/27/22 1504     (approximate)   History   Chief Complaint Groin Swelling   HPI  TANMAY HALTEMAN is a 51 y.o. male with past medical history of hypertension who presents to the ED complaining of groin swelling.  Patient reports that he has been dealing with increasing pain and swelling affecting the left side of his scrotum for the past 2 to 3 days.  He states he has noticed a rash over his skin in this area with some scaling and itchiness.  He has not noticed any purulent drainage and denies any fevers.  He has not had any dysuria, hematuria, or penile discharge.  He denies any pain in his abdomen or swollen lymph nodes in his groin.  He is sexually active with 1 partner, denies any concern for sexually transmitted infection.     Physical Exam   Triage Vital Signs: ED Triage Vitals  Enc Vitals Group     BP 03/27/22 1302 (!) 157/91     Pulse Rate 03/27/22 1302 77     Resp 03/27/22 1302 18     Temp 03/27/22 1302 98 F (36.7 C)     Temp src --      SpO2 03/27/22 1302 94 %     Weight 03/27/22 1300 138 lb (62.6 kg)     Height 03/27/22 1300 5\' 5"  (1.651 m)     Head Circumference --      Peak Flow --      Pain Score 03/27/22 1300 7     Pain Loc --      Pain Edu? --      Excl. in GC? --     Most recent vital signs: Vitals:   03/27/22 1530 03/27/22 1726  BP: (!) 150/88 (!) 152/90  Pulse: 80 78  Resp: 18 18  Temp:  98 F (36.7 C)  SpO2: 96% 96%    Constitutional: Alert and oriented. Eyes: Conjunctivae are normal. Head: Atraumatic. Nose: No congestion/rhinnorhea. Mouth/Throat: Mucous membranes are moist.  Cardiovascular: Normal rate, regular rhythm. Grossly normal heart sounds.  2+ radial pulses bilaterally. Respiratory: Normal respiratory effort.  No retractions. Lungs CTAB. Gastrointestinal: Soft and nontender. No distention. Genitourinary: Left  hemiscrotum erythematous and edematous with associated testicular tenderness, scaly rash with no purulent drainage noted. Musculoskeletal: No lower extremity tenderness nor edema.  Neurologic:  Normal speech and language. No gross focal neurologic deficits are appreciated.    ED Results / Procedures / Treatments   Labs (all labs ordered are listed, but only abnormal results are displayed) Labs Reviewed  BASIC METABOLIC PANEL - Abnormal; Notable for the following components:      Result Value   Glucose, Bld 103 (*)    All other components within normal limits  URINALYSIS, ROUTINE W REFLEX MICROSCOPIC - Abnormal; Notable for the following components:   Color, Urine YELLOW (*)    APPearance CLEAR (*)    Hgb urine dipstick SMALL (*)    Bacteria, UA RARE (*)    All other components within normal limits  CHLAMYDIA/NGC RT PCR ([facility] ONLY)            CBC   RADIOLOGY Scrotal ultrasound reviewed and interpreted by me with inflammatory changes in the area of epididymis, no evidence of torsion.  PROCEDURES:  Critical Care performed: No  Procedures   MEDICATIONS ORDERED IN ED: Medications  cefTRIAXone (  ROCEPHIN) injection 500 mg (has no administration in time range)  lidocaine (PF) (XYLOCAINE) 1 % injection 5 mL (has no administration in time range)     IMPRESSION / MDM / ASSESSMENT AND PLAN / ED COURSE  I reviewed the triage vital signs and the nursing notes.                              51 y.o. male with past medical history of hypertension who presents to the ED complaining of left testicle and scrotum pain and swelling worsening over the past 2 to 3 days associated with itchy and scaly rash.  Patient's presentation is most consistent with acute presentation with potential threat to life or bodily function.  Differential diagnosis includes, but is not limited to, testicular torsion, epididymitis, scrotal cellulitis, abscess, Fournier's gangrene, urethritis.  Patient  nontoxic-appearing and in no acute distress, vital signs are unremarkable.  He has no abdominal tenderness but does have scrotal edema and tenderness primarily on the left side, no penile lesions noted.  There is some erythematous and warm rash on the left without purulence, no findings to suggest abscess or Fournier's gangrene.  We will further assess with ultrasound, labs thus far are reassuring with no significant anemia, leukocytosis, electrolyte abnormality, or AKI.  Urinalysis shows no signs of infection, we will also check GC/chlamydia testing but lower suspicion for sexually transmitted infection at this time and we will hold off on empiric treatment.  Testicular ultrasound is consistent with epididymitis, no evidence of torsion.  In further discussion with the patient with his significant other out of the room, he does report some concern for sexually transmitted infection.  Although gonorrhea and Chlamydia testing is negative, we will cover with IM Rocephin, also treat with course of Levaquin.  He was provided with follow-up with urology, was counseled to return to the ED for new or worsening symptoms.  Patient agrees with plan.      FINAL CLINICAL IMPRESSION(S) / ED DIAGNOSES   Final diagnoses:  Epididymitis     Rx / DC Orders   ED Discharge Orders          Ordered    levofloxacin (LEVAQUIN) 500 MG tablet  Daily        03/27/22 1736             Note:  This document was prepared using Dragon voice recognition software and may include unintentional dictation errors.   Chesley Noon, MD 03/27/22 1739

## 2022-03-27 NOTE — ED Triage Notes (Signed)
Pt via POV from home. Pt c/o groin swelling and rash. States that the rash over his private area is itchy and painful. States he noticed it couple days. Pt is A&OX4 and NAD

## 2023-08-29 ENCOUNTER — Emergency Department: Payer: 59

## 2023-08-29 ENCOUNTER — Emergency Department
Admission: EM | Admit: 2023-08-29 | Discharge: 2023-08-29 | Disposition: A | Discharge status: Home / Self Care | Payer: 59 | Attending: Emergency Medicine | Admitting: Emergency Medicine

## 2023-08-29 ENCOUNTER — Other Ambulatory Visit: Payer: Self-pay

## 2023-08-29 DIAGNOSIS — I10 Essential (primary) hypertension: Secondary | ICD-10-CM | POA: Insufficient documentation

## 2023-08-29 DIAGNOSIS — R079 Chest pain, unspecified: Secondary | ICD-10-CM | POA: Diagnosis not present

## 2023-08-29 DIAGNOSIS — R0789 Other chest pain: Secondary | ICD-10-CM | POA: Diagnosis not present

## 2023-08-29 DIAGNOSIS — Z1152 Encounter for screening for COVID-19: Secondary | ICD-10-CM | POA: Diagnosis not present

## 2023-08-29 DIAGNOSIS — I82819 Embolism and thrombosis of superficial veins of unspecified lower extremities: Secondary | ICD-10-CM | POA: Diagnosis not present

## 2023-08-29 LAB — RESP PANEL BY RT-PCR (RSV, FLU A&B, COVID)  RVPGX2
Influenza A by PCR: NEGATIVE
Influenza B by PCR: NEGATIVE
Resp Syncytial Virus by PCR: NEGATIVE
SARS Coronavirus 2 by RT PCR: NEGATIVE

## 2023-08-29 LAB — TROPONIN I (HIGH SENSITIVITY)
Troponin I (High Sensitivity): 5 ng/L (ref <18)
Troponin I (High Sensitivity): 6 ng/L (ref <18)

## 2023-08-29 LAB — BASIC METABOLIC PANEL
Anion gap: 12 (ref 5–15)
BUN: 11 mg/dL (ref 6–20)
CO2: 24 mmol/L (ref 22–32)
Calcium: 8.9 mg/dL (ref 8.9–10.3)
Chloride: 100 mmol/L (ref 98–111)
Creatinine, Ser: 0.9 mg/dL (ref 0.61–1.24)
GFR, Estimated: >60 mL/min (ref >60)
Glucose, Bld: 135 mg/dL — ABNORMAL HIGH (ref 70–99)
Potassium: 3.8 mmol/L (ref 3.5–5.1)
Sodium: 136 mmol/L (ref 135–145)

## 2023-08-29 LAB — CBC
HCT: 41.2 % (ref 39.0–52.0)
Hemoglobin: 14.5 g/dL (ref 13.0–17.0)
MCH: 32.4 pg (ref 26.0–34.0)
MCHC: 35.2 g/dL (ref 30.0–36.0)
MCV: 92.2 fL (ref 80.0–100.0)
Platelets: 300 10*3/uL (ref 150–400)
RBC: 4.47 MIL/uL (ref 4.22–5.81)
RDW: 12.1 % (ref 11.5–15.5)
WBC: 20.5 10*3/uL — ABNORMAL HIGH (ref 4.0–10.5)
nRBC: 0 % (ref 0.0–0.2)

## 2023-08-29 LAB — D-DIMER, QUANTITATIVE: D-Dimer, Quant: <0.27 ug{FEU}/mL (ref 0.00–0.50)

## 2023-08-29 NOTE — ED Triage Notes (Signed)
Pt here via [facility] with cp. Pt was having left sided cp at work, started yesterday. Pt states pain radiates to his right abd. Pt also having right shoulder and neck pain. Pt received 324 mg of aspirin. Pt also c/o headache.   159/100 92 16 98% RA

## 2023-08-29 NOTE — ED Provider Notes (Signed)
Clinical Course as of 08/29/23 2306  Thu Aug 29, 2023  1945 Patient resting comfortably reports he feels much better now.  He is ambulating without distress. [MQ]  1945 Patient's workup very reassuring.  No hypertension no ripping tearing or moving chest pain to suggest dissection.  Mediastinum appears normal on his x-ray.  Discussed with patient and patient reports he feels ready to go his pain is much better.  Comfortable with plan to follow-up with outpatient cardiology and I think this is reasonable at this time [MQ]  1946 Labs include normal troponin x 2.  D-dimer exclusionary for PE [MQ]  1946 He has a leukocytosis the exact etiology of this to me is not quite certain, however he denies any infectious symptoms his chest x-ray is clear he is currently asymptomatic afebrile.  No clear indication for antibiotic.  [MQ]  1946 Denies any abdominal symptoms no pain or burning with urination etc. [MQ]    Clinical Course User Index [MQ] Sharyn Creamer, MD      Sharyn Creamer, MD 08/29/23 2306

## 2023-08-29 NOTE — ED Provider Notes (Signed)
[facility] Provider Note    Event Date/Time   First MD Initiated Contact with Patient 08/29/23 1740     (approximate)   History   Chest Pain   HPI  Vincent Peterson is a 53 y.o. male reports he is having a achy chest pain for about a day and a half.  Reports the pain under his left breast area, reports he felt like he pulled a muscle and has been having "spasms".  It is sore along the left upper chest.  Reports that when the pain was severe it sort of radiate up towards his neck little bit and some towards his upper abdomen but not into the abdomen or back.  No nausea vomiting no fevers or chills.  Denies history of heart disease  He does have a history of hypertension  Currently reports is achy reproduced by moving his left arm around it hurts points towards his left pectoralis.  Denies any rash or lesions.  No headaches  Denies any fevers chills body aches, pain or burning with urination or abdominal pain   No leg swelling.  No history of blood clots.  Physical Exam   Triage Vital Signs: ED Triage Vitals [08/29/23 1323]  Encounter Vitals Group     BP 134/79     Systolic BP Percentile      Diastolic BP Percentile      Pulse Rate 97     Resp 18     Temp 98.6 F (37 C)     Temp Source Oral     SpO2 96 %     Weight 138 lb 0.1 oz (62.6 kg)     Height 5\' 5"  (1.651 m)     Head Circumference      Peak Flow      Pain Score 9     Pain Loc      Pain Education      Exclude from Growth Chart     Most recent vital signs: Vitals:   08/29/23 1323 08/29/23 1714  BP: 134/79 120/89  Pulse: 97 76  Resp: 18 16  Temp: 98.6 F (37 C)   SpO2: 96% 99%     General: Awake, no distress.  Very pleasant.  Ambulates without difficulty CV:  Good peripheral perfusion.  Normal tones and rate.  Reports of reproducible tenderness over his left upper pectoralis.  Good range of motion of the shoulders.  Fully alert and oriented  There is no rash or lesion over  the left shoulder left upper chest chest wall Resp:  Normal effort.  Clear bilateral Abd:  No distention.  Soft nontender nondistended throughout Other:  Warm well-perfused.  No edema in the lower extremities   ED Results / Procedures / Treatments   Labs (all labs ordered are listed, but only abnormal results are displayed) Labs Reviewed  BASIC METABOLIC PANEL - Abnormal; Notable for the following components:      Result Value   Glucose, Bld 135 (*)    All other components within normal limits  CBC - Abnormal; Notable for the following components:   WBC 20.5 (*)    All other components within normal limits  RESP PANEL BY RT-PCR (RSV, FLU A&B, COVID)  RVPGX2  D-DIMER, QUANTITATIVE  TROPONIN I (HIGH SENSITIVITY)  TROPONIN I (HIGH SENSITIVITY)     EKG  And inter by me at 1330 heart rate 95 QRS 100 QTc 420   Compared with October 22, 2021 no major changes  noted  RADIOLOGY  Chest x-ray interpreted by me as normal  DG Chest 2 View Result Date: 08/29/2023 CLINICAL DATA:  Chest pain. EXAM: CHEST - 2 VIEW COMPARISON:  10/22/2021. FINDINGS: Bilateral lung fields are clear. Bilateral costophrenic angles are clear. Normal cardio-mediastinal silhouette. No acute osseous abnormalities. The soft tissues are within normal limits. IMPRESSION: No active cardiopulmonary disease. Electronically Signed   By: Jules Schick M.D.   On: 08/29/2023 14:56      PROCEDURES:  Critical Care performed: No  Procedures   MEDICATIONS ORDERED IN ED: Medications - No data to display   IMPRESSION / MDM / ASSESSMENT AND PLAN / ED COURSE  I reviewed the triage vital signs and the nursing notes.                              Differential diagnosis includes, but is not limited to, ACS, aortic dissection, pulmonary embolism, cardiac tamponade, pneumothorax, pneumonia, pericarditis, myocarditis, GI-related causes including esophagitis/gastritis, and musculoskeletal chest wall pain.     Patient's  presentation is most consistent with acute complicated illness / injury requiring diagnostic workup.      Clinical Course as of 08/29/23 1947  Thu Aug 29, 2023  1945 Patient resting comfortably reports he feels much better now.  He is ambulating without distress. [MQ]  1945 Patient's workup very reassuring.  No hypertension no ripping tearing or moving chest pain to suggest dissection.  Mediastinum appears normal on his x-ray.  Discussed with patient and patient reports he feels ready to go his pain is much better.  Comfortable with plan to follow-up with outpatient cardiology and I think this is reasonable at this time [MQ]  1946 Labs include normal troponin x 2.  D-dimer exclusionary for PE [MQ], low D-dimer also argues against concerns for dissection  1946 He has a leukocytosis the exact etiology of this to me is not quite certain, however he denies any infectious symptoms his chest x-ray is clear he is currently asymptomatic afebrile.  No clear indication for antibiotic.  [MQ]  1946 Denies any abdominal symptoms no pain or burning with urination etc. [MQ]    Clinical Course User Index [MQ] Sharyn Creamer, MD   Return precautions and treatment recommendations and follow-up discussed with the patient who is agreeable with the plan.   FINAL CLINICAL IMPRESSION(S) / ED DIAGNOSES   Final diagnoses:  Atypical chest pain  Other chest pain     Rx / DC Orders   ED Discharge Orders          Ordered    Ambulatory referral to Cardiology        08/29/23 1947             Note:  This document was prepared using Dragon voice recognition software and may include unintentional dictation errors.   Sharyn Creamer, MD 08/29/23 [phone]

## 2023-08-29 NOTE — Discharge Instructions (Signed)
You have been seen in the Emergency Department (ED) today for chest pain.  As we have discussed today’s test results are normal, but you may require further testing. ° °Please follow up with the recommended doctor as instructed above in these documents regarding today’s emergent visit and your recent symptoms to discuss further management.  Continue to take your regular medications. If you are not doing so already, please also take a daily baby aspirin (81 mg), at least until you follow up with your doctor. ° °Return to the Emergency Department (ED) if you experience any further chest pain/pressure/tightness, difficulty breathing, or sudden sweating, or other symptoms that concern you. ° °

## 2023-10-27 IMAGING — CR DG CHEST 2V
2 series · 2 of 2 positions shown · non-contrast
Comparison: Chest x-ray dated October 17, 2017.

CLINICAL DATA: Intermittent left-sided chest pain for the past 2
days.

EXAM:
CHEST - 2 VIEW

[chest pa]
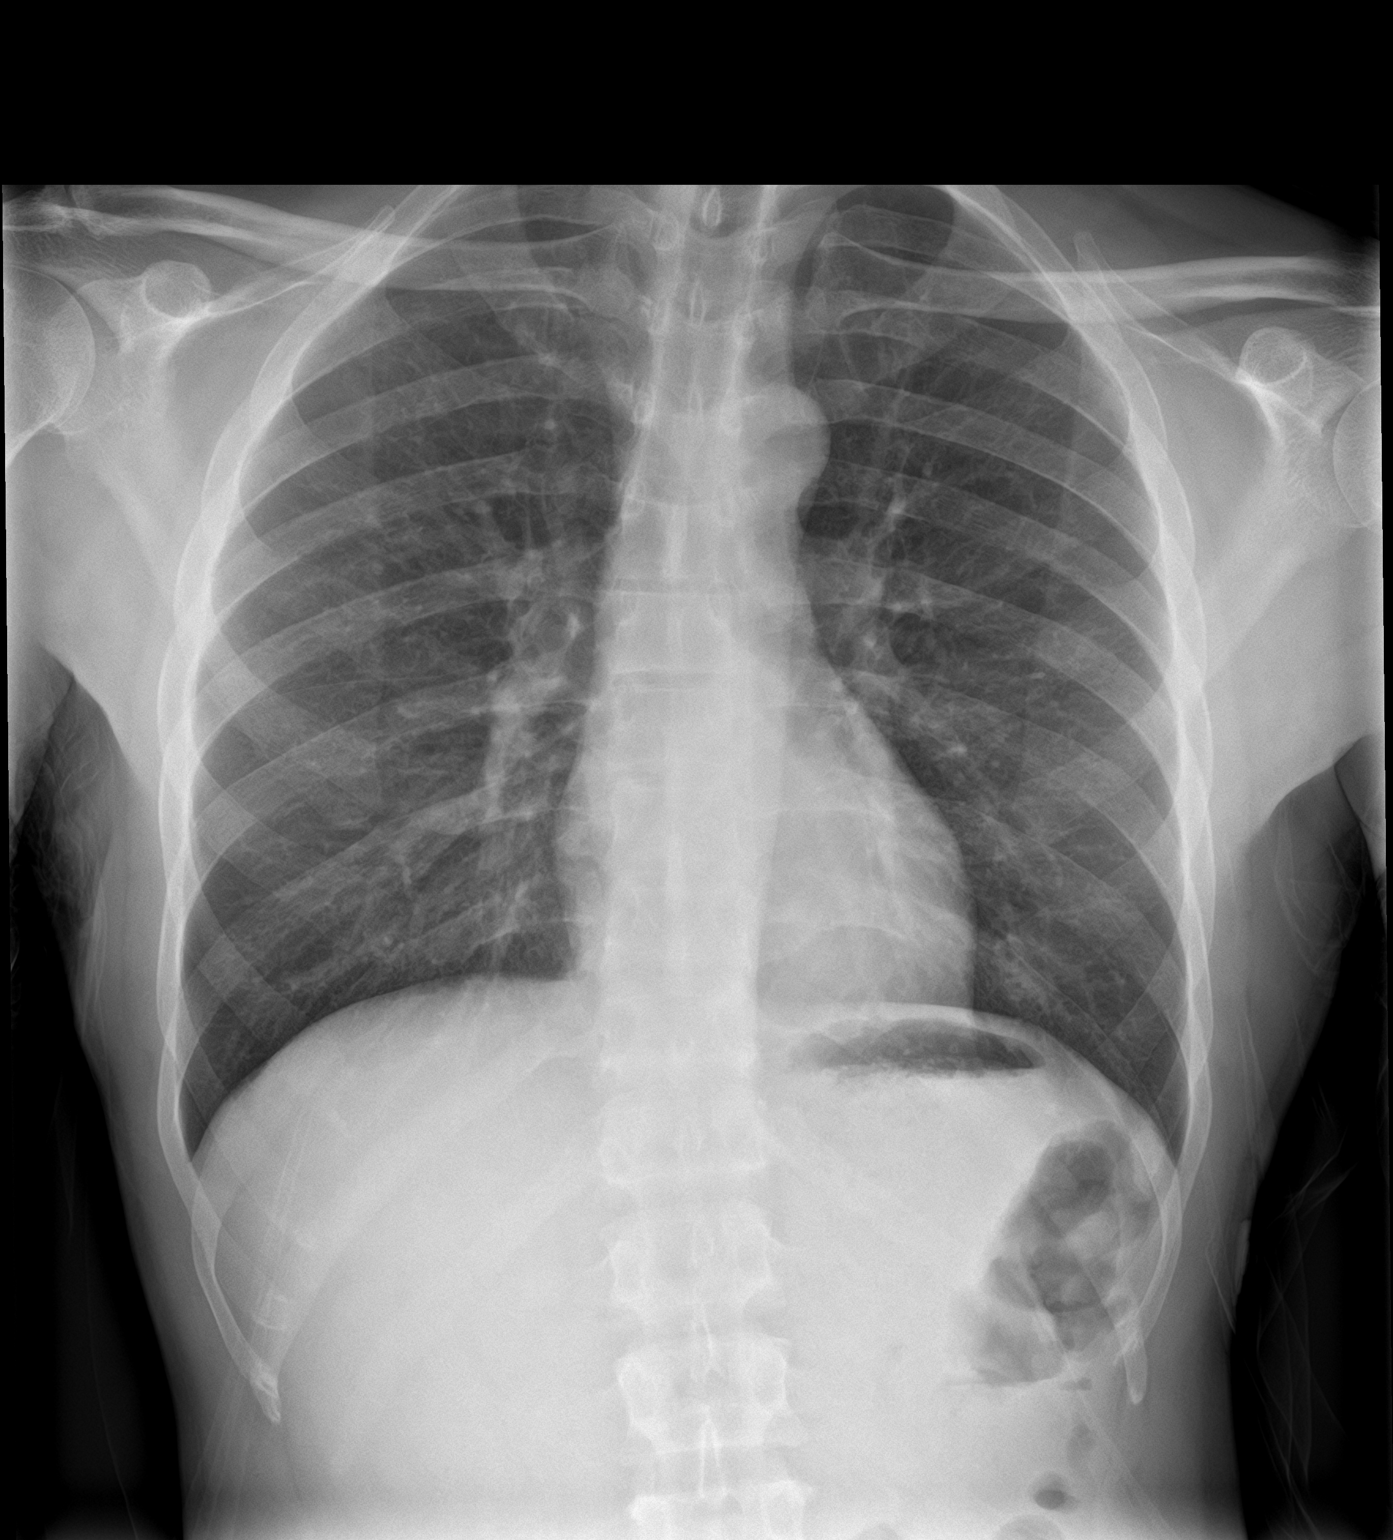

[chest lat]
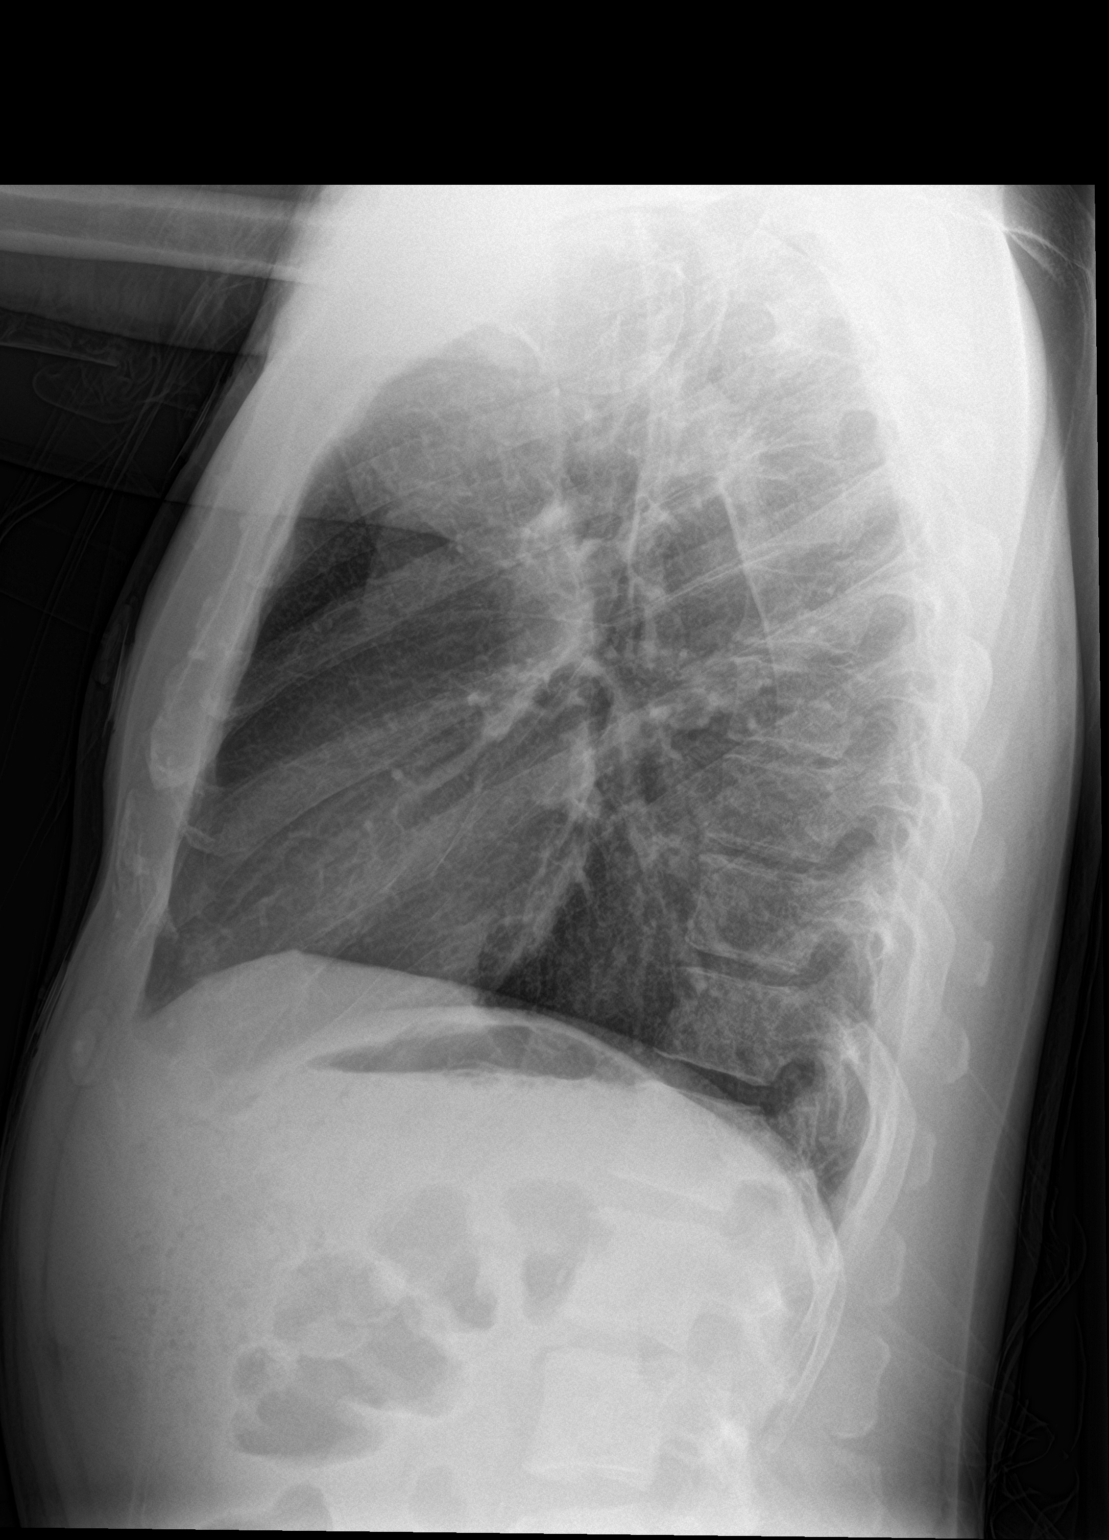

[2 of 2 positions shown; findings below may reference images not displayed]

FINDINGS: The heart size and mediastinal contours are within normal limits.
Both lungs are clear. The visualized skeletal structures are
unremarkable.
IMPRESSION: No active cardiopulmonary disease.
